# Patient Record
Sex: Male | Born: 1940 | Race: White | Hispanic: No | Marital: Married | Smoking: Former smoker
Health system: Southern US, Community
[De-identification: ages and names within clinical notes are randomized; demographics above are authoritative.]

## PROBLEM LIST (undated history)

## (undated) DIAGNOSIS — R52 Pain, unspecified: Secondary | ICD-10-CM

## (undated) DIAGNOSIS — I499 Cardiac arrhythmia, unspecified: Secondary | ICD-10-CM

## (undated) DIAGNOSIS — R51 Headache: Secondary | ICD-10-CM

## (undated) DIAGNOSIS — I2699 Other pulmonary embolism without acute cor pulmonale: Secondary | ICD-10-CM

## (undated) DIAGNOSIS — I493 Ventricular premature depolarization: Secondary | ICD-10-CM

## (undated) DIAGNOSIS — M79606 Pain in leg, unspecified: Secondary | ICD-10-CM

## (undated) DIAGNOSIS — M25552 Pain in left hip: Secondary | ICD-10-CM

## (undated) DIAGNOSIS — K219 Gastro-esophageal reflux disease without esophagitis: Secondary | ICD-10-CM

## (undated) DIAGNOSIS — T791XXA Fat embolism (traumatic), initial encounter: Secondary | ICD-10-CM

## (undated) DIAGNOSIS — I10 Essential (primary) hypertension: Secondary | ICD-10-CM

## (undated) DIAGNOSIS — G8929 Other chronic pain: Secondary | ICD-10-CM

## (undated) DIAGNOSIS — N529 Male erectile dysfunction, unspecified: Secondary | ICD-10-CM

## (undated) DIAGNOSIS — M1990 Unspecified osteoarthritis, unspecified site: Secondary | ICD-10-CM

## (undated) DIAGNOSIS — E785 Hyperlipidemia, unspecified: Secondary | ICD-10-CM

## (undated) DIAGNOSIS — F419 Anxiety disorder, unspecified: Secondary | ICD-10-CM

## (undated) HISTORY — DX: Hyperlipidemia, unspecified: E78.5

## (undated) HISTORY — PX: EYE SURGERY: SHX253

## (undated) HISTORY — PX: JOINT REPLACEMENT: SHX530

## (undated) HISTORY — DX: Gastro-esophageal reflux disease without esophagitis: K21.9

## (undated) HISTORY — DX: Anxiety disorder, unspecified: F41.9

## (undated) HISTORY — DX: Male erectile dysfunction, unspecified: N52.9

## (undated) HISTORY — DX: Unspecified osteoarthritis, unspecified site: M19.90

## (undated) HISTORY — PX: HERNIA REPAIR: SHX51

## (undated) HISTORY — DX: Ventricular premature depolarization: I49.3

---

## 1993-11-04 DIAGNOSIS — T791XXA Fat embolism (traumatic), initial encounter: Secondary | ICD-10-CM

## 1993-11-04 HISTORY — DX: Fat embolism (traumatic), initial encounter: T79.1XXA

## 1993-11-04 HISTORY — PX: KNEE SURGERY: SHX244

## 2004-08-21 ENCOUNTER — Encounter: Admission: RE | Admit: 2004-08-21 | Discharge: 2004-08-21 | Payer: Self-pay | Admitting: Orthopaedic Surgery

## 2004-08-24 ENCOUNTER — Emergency Department (HOSPITAL_COMMUNITY): Admission: EM | Admit: 2004-08-24 | Discharge: 2004-08-25 | Payer: Self-pay | Admitting: *Deleted

## 2005-05-13 ENCOUNTER — Inpatient Hospital Stay (HOSPITAL_COMMUNITY): Admission: RE | Admit: 2005-05-13 | Discharge: 2005-05-16 | Payer: Self-pay | Admitting: Orthopaedic Surgery

## 2005-06-07 ENCOUNTER — Inpatient Hospital Stay (HOSPITAL_COMMUNITY): Admission: EM | Admit: 2005-06-07 | Discharge: 2005-06-07 | Payer: Self-pay | Admitting: Emergency Medicine

## 2006-07-05 ENCOUNTER — Emergency Department (HOSPITAL_COMMUNITY): Admission: EM | Admit: 2006-07-05 | Discharge: 2006-07-05 | Payer: Self-pay | Admitting: Emergency Medicine

## 2009-01-15 ENCOUNTER — Emergency Department (HOSPITAL_COMMUNITY): Admission: EM | Admit: 2009-01-15 | Discharge: 2009-01-15 | Payer: Self-pay | Admitting: Emergency Medicine

## 2010-03-29 ENCOUNTER — Ambulatory Visit (HOSPITAL_COMMUNITY): Admission: RE | Admit: 2010-03-29 | Discharge: 2010-03-29 | Payer: Self-pay | Admitting: Orthopedic Surgery

## 2010-06-25 ENCOUNTER — Emergency Department (HOSPITAL_COMMUNITY): Admission: EM | Admit: 2010-06-25 | Discharge: 2010-06-25 | Payer: Self-pay | Admitting: Emergency Medicine

## 2010-08-04 DIAGNOSIS — I2699 Other pulmonary embolism without acute cor pulmonale: Secondary | ICD-10-CM

## 2010-08-04 HISTORY — DX: Other pulmonary embolism without acute cor pulmonale: I26.99

## 2010-08-11 ENCOUNTER — Ambulatory Visit: Payer: Self-pay | Admitting: Pulmonary Disease

## 2010-08-11 ENCOUNTER — Inpatient Hospital Stay (HOSPITAL_COMMUNITY): Admission: EM | Admit: 2010-08-11 | Discharge: 2010-08-20 | Payer: Self-pay | Admitting: Emergency Medicine

## 2010-08-13 ENCOUNTER — Encounter (INDEPENDENT_AMBULATORY_CARE_PROVIDER_SITE_OTHER): Payer: Self-pay | Admitting: Internal Medicine

## 2010-08-13 ENCOUNTER — Ambulatory Visit: Payer: Self-pay | Admitting: Vascular Surgery

## 2010-08-13 ENCOUNTER — Encounter: Payer: Self-pay | Admitting: Pulmonary Disease

## 2010-11-24 ENCOUNTER — Encounter: Payer: Self-pay | Admitting: Orthopaedic Surgery

## 2011-01-16 LAB — CBC
HCT: 32.7 % — ABNORMAL LOW (ref 39.0–52.0)
HCT: 32.9 % — ABNORMAL LOW (ref 39.0–52.0)
HCT: 34 % — ABNORMAL LOW (ref 39.0–52.0)
HCT: 35.2 % — ABNORMAL LOW (ref 39.0–52.0)
HCT: 35.5 % — ABNORMAL LOW (ref 39.0–52.0)
HCT: 36.8 % — ABNORMAL LOW (ref 39.0–52.0)
HCT: 36.9 % — ABNORMAL LOW (ref 39.0–52.0)
HCT: 38.4 % — ABNORMAL LOW (ref 39.0–52.0)
Hemoglobin: 11.2 g/dL — ABNORMAL LOW (ref 13.0–17.0)
Hemoglobin: 11.2 g/dL — ABNORMAL LOW (ref 13.0–17.0)
Hemoglobin: 11.8 g/dL — ABNORMAL LOW (ref 13.0–17.0)
Hemoglobin: 11.9 g/dL — ABNORMAL LOW (ref 13.0–17.0)
Hemoglobin: 12.1 g/dL — ABNORMAL LOW (ref 13.0–17.0)
Hemoglobin: 12.5 g/dL — ABNORMAL LOW (ref 13.0–17.0)
Hemoglobin: 12.5 g/dL — ABNORMAL LOW (ref 13.0–17.0)
MCH: 28.4 pg (ref 26.0–34.0)
MCH: 28.7 pg (ref 26.0–34.0)
MCH: 28.7 pg (ref 26.0–34.0)
MCH: 28.8 pg (ref 26.0–34.0)
MCH: 29.1 pg (ref 26.0–34.0)
MCH: 29.2 pg (ref 26.0–34.0)
MCH: 29.4 pg (ref 26.0–34.0)
MCH: 29.8 pg (ref 26.0–34.0)
MCHC: 33.2 g/dL (ref 30.0–36.0)
MCHC: 33.8 g/dL (ref 30.0–36.0)
MCHC: 34 g/dL (ref 30.0–36.0)
MCHC: 34 g/dL (ref 30.0–36.0)
MCHC: 34 g/dL (ref 30.0–36.0)
MCHC: 34.3 g/dL (ref 30.0–36.0)
MCHC: 34.4 g/dL (ref 30.0–36.0)
MCV: 84.4 fL (ref 78.0–100.0)
MCV: 84.6 fL (ref 78.0–100.0)
MCV: 85 fL (ref 78.0–100.0)
MCV: 85.2 fL (ref 78.0–100.0)
MCV: 85.3 fL (ref 78.0–100.0)
MCV: 85.9 fL (ref 78.0–100.0)
MCV: 85.9 fL (ref 78.0–100.0)
Platelets: 190 10*3/uL (ref 150–400)
Platelets: 204 10*3/uL (ref 150–400)
Platelets: 289 10*3/uL (ref 150–400)
Platelets: 296 10*3/uL (ref 150–400)
RBC: 3.7 MIL/uL — ABNORMAL LOW (ref 4.22–5.81)
RBC: 3.78 MIL/uL — ABNORMAL LOW (ref 4.22–5.81)
RBC: 3.99 MIL/uL — ABNORMAL LOW (ref 4.22–5.81)
RBC: 4.14 MIL/uL — ABNORMAL LOW (ref 4.22–5.81)
RBC: 4.16 MIL/uL — ABNORMAL LOW (ref 4.22–5.81)
RBC: 4.16 MIL/uL — ABNORMAL LOW (ref 4.22–5.81)
RBC: 4.36 MIL/uL (ref 4.22–5.81)
RBC: 4.47 MIL/uL (ref 4.22–5.81)
RDW: 12.8 % (ref 11.5–15.5)
RDW: 12.9 % (ref 11.5–15.5)
RDW: 13.3 % (ref 11.5–15.5)
WBC: 10.2 10*3/uL (ref 4.0–10.5)
WBC: 6.8 10*3/uL (ref 4.0–10.5)
WBC: 7 10*3/uL (ref 4.0–10.5)
WBC: 7.3 10*3/uL (ref 4.0–10.5)
WBC: 9.6 10*3/uL (ref 4.0–10.5)

## 2011-01-16 LAB — COMPREHENSIVE METABOLIC PANEL
ALT: 15 U/L (ref 0–53)
ALT: 17 U/L (ref 0–53)
ALT: 27 U/L (ref 0–53)
ALT: 27 U/L (ref 0–53)
ALT: 32 U/L (ref 0–53)
AST: 21 U/L (ref 0–37)
AST: 24 U/L (ref 0–37)
AST: 24 U/L (ref 0–37)
AST: 24 U/L (ref 0–37)
AST: 33 U/L (ref 0–37)
Albumin: 2.2 g/dL — ABNORMAL LOW (ref 3.5–5.2)
Albumin: 2.2 g/dL — ABNORMAL LOW (ref 3.5–5.2)
Albumin: 2.3 g/dL — ABNORMAL LOW (ref 3.5–5.2)
Alkaline Phosphatase: 46 U/L (ref 39–117)
Alkaline Phosphatase: 47 U/L (ref 39–117)
Alkaline Phosphatase: 48 U/L (ref 39–117)
Alkaline Phosphatase: 56 U/L (ref 39–117)
Alkaline Phosphatase: 56 U/L (ref 39–117)
BUN: 2 mg/dL — ABNORMAL LOW (ref 6–23)
BUN: 2 mg/dL — ABNORMAL LOW (ref 6–23)
BUN: 2 mg/dL — ABNORMAL LOW (ref 6–23)
BUN: 3 mg/dL — ABNORMAL LOW (ref 6–23)
BUN: 5 mg/dL — ABNORMAL LOW (ref 6–23)
BUN: 5 mg/dL — ABNORMAL LOW (ref 6–23)
BUN: 6 mg/dL (ref 6–23)
CO2: 23 mEq/L (ref 19–32)
CO2: 24 mEq/L (ref 19–32)
CO2: 25 mEq/L (ref 19–32)
CO2: 25 mEq/L (ref 19–32)
CO2: 25 mEq/L (ref 19–32)
CO2: 27 mEq/L (ref 19–32)
CO2: 27 mEq/L (ref 19–32)
Calcium: 8.5 mg/dL (ref 8.4–10.5)
Calcium: 8.7 mg/dL (ref 8.4–10.5)
Calcium: 8.7 mg/dL (ref 8.4–10.5)
Calcium: 8.8 mg/dL (ref 8.4–10.5)
Calcium: 9.2 mg/dL (ref 8.4–10.5)
Calcium: 9.5 mg/dL (ref 8.4–10.5)
Chloride: 106 mEq/L (ref 96–112)
Chloride: 109 mEq/L (ref 96–112)
Chloride: 110 mEq/L (ref 96–112)
Chloride: 111 mEq/L (ref 96–112)
Chloride: 112 mEq/L (ref 96–112)
Chloride: 112 mEq/L (ref 96–112)
Creatinine, Ser: 0.57 mg/dL (ref 0.4–1.5)
Creatinine, Ser: 0.6 mg/dL (ref 0.4–1.5)
Creatinine, Ser: 0.61 mg/dL (ref 0.4–1.5)
Creatinine, Ser: 0.69 mg/dL (ref 0.4–1.5)
Creatinine, Ser: 0.72 mg/dL (ref 0.4–1.5)
Creatinine, Ser: 0.77 mg/dL (ref 0.4–1.5)
GFR calc Af Amer: >60 mL/min (ref >60)
GFR calc Af Amer: >60 mL/min (ref >60)
GFR calc Af Amer: >60 mL/min (ref >60)
GFR calc Af Amer: >60 mL/min (ref >60)
GFR calc Af Amer: >60 mL/min (ref >60)
GFR calc non Af Amer: >60 mL/min (ref >60)
GFR calc non Af Amer: >60 mL/min (ref >60)
GFR calc non Af Amer: >60 mL/min (ref >60)
GFR calc non Af Amer: >60 mL/min (ref >60)
GFR calc non Af Amer: >60 mL/min (ref >60)
GFR calc non Af Amer: >60 mL/min (ref >60)
GFR calc non Af Amer: >60 mL/min (ref >60)
GFR calc non Af Amer: >60 mL/min (ref >60)
Glucose, Bld: 124 mg/dL — ABNORMAL HIGH (ref 70–99)
Glucose, Bld: 93 mg/dL (ref 70–99)
Glucose, Bld: 95 mg/dL (ref 70–99)
Glucose, Bld: 97 mg/dL (ref 70–99)
Glucose, Bld: 97 mg/dL (ref 70–99)
Glucose, Bld: 97 mg/dL (ref 70–99)
Glucose, Bld: 98 mg/dL (ref 70–99)
Potassium: 3.2 mEq/L — ABNORMAL LOW (ref 3.5–5.1)
Potassium: 3.7 mEq/L (ref 3.5–5.1)
Potassium: 3.7 mEq/L (ref 3.5–5.1)
Potassium: 3.9 mEq/L (ref 3.5–5.1)
Potassium: 4.2 mEq/L (ref 3.5–5.1)
Sodium: 138 mEq/L (ref 135–145)
Sodium: 139 mEq/L (ref 135–145)
Sodium: 139 mEq/L (ref 135–145)
Sodium: 141 mEq/L (ref 135–145)
Total Bilirubin: 0.8 mg/dL (ref 0.3–1.2)
Total Bilirubin: 0.8 mg/dL (ref 0.3–1.2)
Total Bilirubin: 0.8 mg/dL (ref 0.3–1.2)
Total Bilirubin: 0.8 mg/dL (ref 0.3–1.2)
Total Bilirubin: 0.9 mg/dL (ref 0.3–1.2)
Total Bilirubin: 0.9 mg/dL (ref 0.3–1.2)
Total Protein: 5.7 g/dL — ABNORMAL LOW (ref 6.0–8.3)
Total Protein: 5.7 g/dL — ABNORMAL LOW (ref 6.0–8.3)
Total Protein: 6 g/dL (ref 6.0–8.3)
Total Protein: 6.5 g/dL (ref 6.0–8.3)

## 2011-01-16 LAB — DIFFERENTIAL
Basophils Relative: 0 % (ref 0–1)
Eosinophils Absolute: 0.3 10*3/uL (ref 0.0–0.7)
Eosinophils Absolute: 0.4 10*3/uL (ref 0.0–0.7)
Eosinophils Relative: 5 % (ref 0–5)
Eosinophils Relative: 5 % (ref 0–5)
Lymphocytes Relative: 13 % (ref 12–46)
Lymphs Abs: 1 10*3/uL (ref 0.7–4.0)
Lymphs Abs: 1 10*3/uL (ref 0.7–4.0)
Monocytes Absolute: 0.9 10*3/uL (ref 0.1–1.0)
Monocytes Relative: 10 % (ref 3–12)
Monocytes Relative: 11 % (ref 3–12)
Neutrophils Relative %: 69 % (ref 43–77)

## 2011-01-16 LAB — HEPARIN LEVEL (UNFRACTIONATED)
Heparin Unfractionated: 0.23 IU/mL — ABNORMAL LOW (ref 0.30–0.70)
Heparin Unfractionated: 0.46 IU/mL (ref 0.30–0.70)
Heparin Unfractionated: 0.47 IU/mL (ref 0.30–0.70)
Heparin Unfractionated: 0.51 IU/mL (ref 0.30–0.70)
Heparin Unfractionated: 0.67 IU/mL (ref 0.30–0.70)

## 2011-01-16 LAB — CULTURE, BLOOD (ROUTINE X 2)
Culture  Setup Time: 201110101735
Culture  Setup Time: 201110101735

## 2011-01-16 LAB — MRSA PCR SCREENING: MRSA by PCR: NEGATIVE

## 2011-01-16 LAB — GLUCOSE, CAPILLARY
Glucose-Capillary: 125 mg/dL — ABNORMAL HIGH (ref 70–99)
Glucose-Capillary: 128 mg/dL — ABNORMAL HIGH (ref 70–99)
Glucose-Capillary: 129 mg/dL — ABNORMAL HIGH (ref 70–99)
Glucose-Capillary: 143 mg/dL — ABNORMAL HIGH (ref 70–99)

## 2011-01-16 LAB — PROTIME-INR
INR: 1.18 (ref 0.00–1.49)
INR: 1.32 (ref 0.00–1.49)
INR: 1.36 (ref 0.00–1.49)
INR: 1.53 — ABNORMAL HIGH (ref 0.00–1.49)
INR: 2.28 — ABNORMAL HIGH (ref 0.00–1.49)
INR: 2.34 — ABNORMAL HIGH (ref 0.00–1.49)
Prothrombin Time: 15.2 seconds (ref 11.6–15.2)
Prothrombin Time: 16.6 seconds — ABNORMAL HIGH (ref 11.6–15.2)
Prothrombin Time: 17 seconds — ABNORMAL HIGH (ref 11.6–15.2)
Prothrombin Time: 17.3 seconds — ABNORMAL HIGH (ref 11.6–15.2)
Prothrombin Time: 18.6 seconds — ABNORMAL HIGH (ref 11.6–15.2)
Prothrombin Time: 21.2 seconds — ABNORMAL HIGH (ref 11.6–15.2)
Prothrombin Time: 24 seconds — ABNORMAL HIGH (ref 11.6–15.2)
Prothrombin Time: 25.3 seconds — ABNORMAL HIGH (ref 11.6–15.2)
Prothrombin Time: 29.4 seconds — ABNORMAL HIGH (ref 11.6–15.2)

## 2011-01-16 LAB — BLOOD GAS, ARTERIAL
Acid-base deficit: 1.1 mmol/L (ref 0.0–2.0)
Drawn by: 29165
O2 Saturation: 89.3 %
pH, Arterial: 7.439 (ref 7.350–7.450)

## 2011-01-16 LAB — POCT I-STAT 3, ART BLOOD GAS (G3+)
Acid-base deficit: 3 mmol/L — ABNORMAL HIGH (ref 0.0–2.0)
Bicarbonate: 21.7 mEq/L (ref 20.0–24.0)
Patient temperature: 98.7
pH, Arterial: 7.405 (ref 7.350–7.450)

## 2011-01-16 LAB — BRAIN NATRIURETIC PEPTIDE: Pro B Natriuretic peptide (BNP): 304 pg/mL — ABNORMAL HIGH (ref 0.0–100.0)

## 2011-01-16 LAB — BASIC METABOLIC PANEL
CO2: 25 mEq/L (ref 19–32)
Chloride: 107 mEq/L (ref 96–112)
Creatinine, Ser: 0.7 mg/dL (ref 0.4–1.5)
GFR calc Af Amer: >60 mL/min (ref >60)
Potassium: 4 mEq/L (ref 3.5–5.1)

## 2011-01-16 LAB — PROTHROMBIN GENE MUTATION

## 2011-01-16 LAB — BETA-2-GLYCOPROTEIN I ABS, IGG/M/A
Beta-2-Glycoprotein I IgA: 16 A Units (ref <20)
Beta-2-Glycoprotein I IgM: 2 M Units (ref <20)

## 2011-01-16 LAB — PHOSPHORUS: Phosphorus: 2.5 mg/dL (ref 2.3–4.6)

## 2011-01-16 LAB — POCT CARDIAC MARKERS: Troponin i, poc: 0.07 ng/mL (ref 0.00–0.09)

## 2011-01-16 LAB — PROTEIN C ACTIVITY: Protein C Activity: 97 % (ref 75–133)

## 2011-01-16 LAB — LUPUS ANTICOAGULANT PANEL
Lupus Anticoagulant: DETECTED — AB
PTTLA 4:1 Mix: 108.8 secs — ABNORMAL HIGH (ref 30.0–45.6)
PTTLA Confirmation: 17.8 secs — ABNORMAL HIGH (ref <8.0)
dRVVT Incubated 1:1 Mix: 42.8 secs (ref 36.2–44.3)

## 2011-01-16 LAB — APTT: aPTT: 131 seconds — ABNORMAL HIGH (ref 24–37)

## 2011-01-16 LAB — LEGIONELLA ANTIGEN, URINE

## 2011-01-18 LAB — CBC
MCH: 29.5 pg (ref 26.0–34.0)
MCHC: 34.4 g/dL (ref 30.0–36.0)
Platelets: 155 10*3/uL (ref 150–400)
RDW: 13.4 % (ref 11.5–15.5)

## 2011-01-18 LAB — COMPREHENSIVE METABOLIC PANEL
AST: 21 U/L (ref 0–37)
Albumin: 3.8 g/dL (ref 3.5–5.2)
Calcium: 9.9 mg/dL (ref 8.4–10.5)
Creatinine, Ser: 0.85 mg/dL (ref 0.4–1.5)
GFR calc Af Amer: >60 mL/min (ref >60)
GFR calc non Af Amer: >60 mL/min (ref >60)

## 2011-01-18 LAB — DIFFERENTIAL
Eosinophils Relative: 0 % (ref 0–5)
Lymphocytes Relative: 9 % — ABNORMAL LOW (ref 12–46)
Lymphs Abs: 0.6 10*3/uL — ABNORMAL LOW (ref 0.7–4.0)
Monocytes Absolute: 0.5 10*3/uL (ref 0.1–1.0)

## 2011-01-18 LAB — POCT CARDIAC MARKERS
CKMB, poc: <1 ng/mL — ABNORMAL LOW (ref 1.0–8.0)
CKMB, poc: <1 ng/mL — ABNORMAL LOW (ref 1.0–8.0)
Myoglobin, poc: 53.3 ng/mL (ref 12–200)
Troponin i, poc: <0.05 ng/mL (ref 0.00–0.09)

## 2011-01-18 LAB — LIPASE, BLOOD: Lipase: 24 U/L (ref 11–59)

## 2011-02-14 LAB — COMPREHENSIVE METABOLIC PANEL
ALT: 16 U/L (ref 0–53)
AST: 20 U/L (ref 0–37)
Alkaline Phosphatase: 48 U/L (ref 39–117)
CO2: 28 mEq/L (ref 19–32)
Chloride: 104 mEq/L (ref 96–112)
GFR calc Af Amer: >60 mL/min (ref >60)
GFR calc non Af Amer: >60 mL/min (ref >60)
Potassium: 4.1 mEq/L (ref 3.5–5.1)
Sodium: 136 mEq/L (ref 135–145)
Total Bilirubin: 1.3 mg/dL — ABNORMAL HIGH (ref 0.3–1.2)

## 2011-02-14 LAB — CBC
Hemoglobin: 14.7 g/dL (ref 13.0–17.0)
RBC: 4.94 MIL/uL (ref 4.22–5.81)
WBC: 5.2 10*3/uL (ref 4.0–10.5)

## 2011-02-14 LAB — POCT CARDIAC MARKERS
Myoglobin, poc: 35.2 ng/mL (ref 12–200)
Myoglobin, poc: 39.8 ng/mL (ref 12–200)

## 2011-02-14 LAB — DIFFERENTIAL
Basophils Absolute: 0 10*3/uL (ref 0.0–0.1)
Basophils Relative: 1 % (ref 0–1)
Eosinophils Absolute: 0.1 10*3/uL (ref 0.0–0.7)
Eosinophils Relative: 1 % (ref 0–5)
Lymphs Abs: 1 10*3/uL (ref 0.7–4.0)

## 2011-02-14 LAB — PROTIME-INR: Prothrombin Time: 13.5 seconds (ref 11.6–15.2)

## 2011-03-22 NOTE — H&P (Signed)
NAMEBUCK, MCAFFEE NO.:  [id number]   MEDICAL RECORD NO.:  [id number]          PATIENT TYPE:  INP   LOCATION:  5016                         FACILITY:  [facility]   PHYSICIAN:  Burnard Bunting, M.D.    DATE OF BIRTH:  [birth year]   DATE OF ADMISSION:  06/07/2005  DATE OF DISCHARGE:                                HISTORY & PHYSICAL   CHIEF COMPLAINT:  Left hip pain.   HISTORY OF PRESENT ILLNESS:  [name] is a 70 year old male, one  month status post THA revision, who reports left hip pain after sleeping  tonight.  Discontinue the abduction pillow today.  The hip dislocated in his  sleep tonight.   PAST MEDICAL/SURGICAL HISTORY:  1.  Notable for hypertension.  2.  Borderline diabetes.  3.  Total hip arthroplasty in 1979, complicated by fat embolism with six      revisions since that time.   CURRENT MEDICATIONS:  1.  Atenolol.  2.  Aciphex.  3.  Vytorin.   ALLERGIES:  No known drug allergies.   PHYSICAL EXAMINATION:  GENERAL:  He is in no distress.  CHEST:  Clear to auscultation.  HEART:  A regular rate and rhythm.  ABDOMEN:  Benign.  EXTREMITIES:  Left lower extremity is shortened and externally rotated.  Dorsiflexion and plantar flexion intact.  Pulses are intact.   Electrocardiogram:  A normal sinus rhythm.   Hematocrit 36.  Sodium 143.9.   X-rays show posterior left hip dislocation.   IMPRESSION:  Left hip dislocation.   PLAN:  A closed reduction.  The risks and benefits were discussed with the  patient.  They include but are not limited to a fracture and inability to  reduce the hip.  He __________ the case.  The patient will be awakened and  scheduled for a possible open reduction by Dr. Loraine Leriche C. Yates later today.       GSD/MEDQ  D:  06/07/2005  T:  06/07/2005  Job:  [id number]

## 2011-03-22 NOTE — Op Note (Signed)
NAMENOAM, KARAFFA                 ACCOUNT NO.:  [id number]   MEDICAL RECORD NO.:  [id number]          PATIENT TYPE:  INP   LOCATION:  2550                         FACILITY:  [facility]   PHYSICIAN:  Mark C. Ophelia Charter, M.D.    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   OPERATION PERFORMED:  Left total hip arthroplasty revision of acetabular  component, ball exchange and trochanteric wiring.   SURGEON:  Mark C. Ophelia Charter, M.D.   ASSISTANT:  Vanita Panda. Magnus Ivan, M.D.   ANESTHESIA:  General orotracheal anesthesia.   ESTIMATED BLOOD LOSS:  1400 mL.   BLOOD REPLACED:  One unit packed cells.   DESCRIPTION OF PROCEDURE:  After induction of general anesthesia,  orotracheal intubation, preoperative Ancef prophylaxis, patient was placed  in the lateral position with the left hip up and an axillary roll. Standard  prepping and draping was performed for total hip arthroplasty, sterile skin  marker on the planned incision which was one of four or five incisions  already on his hip. Posterior approach was made.  Trochanter was off and  there were immediately two loose cables with severe metallosis present.  Cables were cut with a large bolt cutter and cables were removed.  Trochanter was displaced superiorly and elevation of the trochanter was  performed following down the soft tissue with polyethylene debris and metal  colored synovium down to the acetabulum.  The hip was flipped and was opened  80 to 90 degrees of abduction.  It was loose and one of the screws was  broken which was apparent once the cup was removed using osteotome, breaking  the wire and then  separating the poly from the shell.  One screw was  removed.  A second screw from old previous revision was buried underneath  the bone.  Final screw that had broken had to be removed with a small  osteotome using the Nezhat system, finally grasping it with a rongeur and  extracting it.  A Sony reamer was inserted.  The old cup was a 70 and a  secure fit Osteonics 74 mm cup was selected.  40 cc of bone graft was placed  in the acetabulum after meticulous debridement scraping with the curettes.  There was one central hole that did not require ___________ and bone graft  was placed in it and reamer was placed in reverse.  A 74 mm cup was  inserted.  There was no solid posterior wall and the posterolateral rim was  paper thin and had broken similar to posterior lateral acetabular fracture.  Laterally, there was still satisfactory bone.  Site nuts were palpated and a  total of four screws were inserted varying between 15 and 30 mm in length.  After irrigation with saline solution, trial was inserted and a 36 ball  was  selected, +5 exchanging it for the 0 ball.  There was excellent stability  with flexion to 90 degrees, internal rotation to 90.  Permanent acetabular  liner was inserted, placed with the lip posterolateral.  A 36 ball was  popped on +5 after cleaning the trunnion.  The trochanter was then wired  back using three figure-of-eight sutures 18 gauge wires.  The trochanter  piece was thinned and was retracted an although it was not quite anatomic  despite the multiple wiring plus multiple sutures through the adjacent soft  tissue, it did give some stability to the hip and reduced the trochanter in  satisfactory position.  Although there was still a wobble.  Previously the  cables were cut out and it was felt that the cables and the cable clamp  through the soft trochanter would not be adequate.  Wires were twisted down  and figure-of-eight sutures placed.  Tensor fascia was closed with  #1  Tycron, 2-0 Vicryl in the subcutaneous tissues, skin staple closure and  Marcaine infiltration and knee immobilizer after dressing.  The patient  tolerated the procedure well and was transferred to recovery in stable  condition.       MCY/MEDQ  D:  05/13/2005  T:  05/14/2005  Job:  [id number]

## 2011-03-22 NOTE — Op Note (Signed)
NAMEKAVONTE, BEARSE NO.:  [id number]   MEDICAL RECORD NO.:  [id number]          PATIENT TYPE:  INP   LOCATION:  5016                         FACILITY:  [facility]   PHYSICIAN:  Burnard Bunting, M.D.    DATE OF BIRTH:  08/21/1941   DATE OF PROCEDURE:  06/07/2005  DATE OF DISCHARGE:                                 OPERATIVE REPORT   PREOPERATIVE DIAGNOSIS:  Left total hip dislocation.   POSTOPERATIVE DIAGNOSIS:  Left total hip dislocation.   PROCEDURE:  Left total hip closed reduction.   SURGEON:  Burnard Bunting, M.D.   ANESTHESIA:  General.   ESTIMATED BLOOD LOSS:  Minimal.   PROCEDURE IN DETAIL:  The patient was brought to the operating room where  general endotracheal anesthesia was induced.  Using a combination of hip  flexion and distraction, the hip was reduced with an audible clunk with  minimal effort.  The hip was relatively stable with internal and external  rotation.  Fluoroscopic guidance confirmed reduction.  Knee immobilizer was  placed.  Leg lengths were approximately equal.  The patient was extubated  and transferred to the recovery room in stable condition.       GSD/MEDQ  D:  06/07/2005  T:  06/07/2005  Job:  [id number]

## 2011-11-14 DIAGNOSIS — H9209 Otalgia, unspecified ear: Secondary | ICD-10-CM | POA: Diagnosis not present

## 2011-11-14 DIAGNOSIS — H6120 Impacted cerumen, unspecified ear: Secondary | ICD-10-CM | POA: Diagnosis not present

## 2011-11-20 DIAGNOSIS — S8990XA Unspecified injury of unspecified lower leg, initial encounter: Secondary | ICD-10-CM | POA: Diagnosis not present

## 2011-11-20 DIAGNOSIS — I2699 Other pulmonary embolism without acute cor pulmonale: Secondary | ICD-10-CM | POA: Diagnosis not present

## 2011-11-20 DIAGNOSIS — Z7901 Long term (current) use of anticoagulants: Secondary | ICD-10-CM | POA: Diagnosis not present

## 2012-01-01 DIAGNOSIS — I2699 Other pulmonary embolism without acute cor pulmonale: Secondary | ICD-10-CM | POA: Diagnosis not present

## 2012-01-01 DIAGNOSIS — Z7901 Long term (current) use of anticoagulants: Secondary | ICD-10-CM | POA: Diagnosis not present

## 2012-02-21 DIAGNOSIS — M169 Osteoarthritis of hip, unspecified: Secondary | ICD-10-CM | POA: Diagnosis not present

## 2012-02-21 DIAGNOSIS — Z7901 Long term (current) use of anticoagulants: Secondary | ICD-10-CM | POA: Diagnosis not present

## 2012-02-21 DIAGNOSIS — I2699 Other pulmonary embolism without acute cor pulmonale: Secondary | ICD-10-CM | POA: Diagnosis not present

## 2012-03-11 DIAGNOSIS — E785 Hyperlipidemia, unspecified: Secondary | ICD-10-CM | POA: Diagnosis not present

## 2012-03-11 DIAGNOSIS — I10 Essential (primary) hypertension: Secondary | ICD-10-CM | POA: Diagnosis not present

## 2012-03-11 DIAGNOSIS — I2699 Other pulmonary embolism without acute cor pulmonale: Secondary | ICD-10-CM | POA: Diagnosis not present

## 2012-03-11 DIAGNOSIS — M1990 Unspecified osteoarthritis, unspecified site: Secondary | ICD-10-CM | POA: Diagnosis not present

## 2012-03-17 DIAGNOSIS — M169 Osteoarthritis of hip, unspecified: Secondary | ICD-10-CM | POA: Diagnosis not present

## 2012-03-31 DIAGNOSIS — I2699 Other pulmonary embolism without acute cor pulmonale: Secondary | ICD-10-CM | POA: Diagnosis not present

## 2012-03-31 DIAGNOSIS — Z7901 Long term (current) use of anticoagulants: Secondary | ICD-10-CM | POA: Diagnosis not present

## 2012-03-31 DIAGNOSIS — M545 Low back pain: Secondary | ICD-10-CM | POA: Diagnosis not present

## 2012-03-31 DIAGNOSIS — M1990 Unspecified osteoarthritis, unspecified site: Secondary | ICD-10-CM | POA: Diagnosis not present

## 2012-04-04 DIAGNOSIS — M545 Low back pain: Secondary | ICD-10-CM | POA: Diagnosis not present

## 2012-04-10 DIAGNOSIS — S32009A Unspecified fracture of unspecified lumbar vertebra, initial encounter for closed fracture: Secondary | ICD-10-CM | POA: Diagnosis not present

## 2012-04-10 DIAGNOSIS — M5137 Other intervertebral disc degeneration, lumbosacral region: Secondary | ICD-10-CM | POA: Diagnosis not present

## 2012-04-16 ENCOUNTER — Telehealth (HOSPITAL_COMMUNITY): Payer: Self-pay

## 2012-04-16 NOTE — Telephone Encounter (Signed)
I received a ref fax from [place] at Dr. Grant Fontana office.  Since the MRI was not done within the [facility] system I called Scott and left a vm stating that we need a copy of the MRI sent to [facility] 1st floor radiology atten [facility]

## 2012-04-17 ENCOUNTER — Other Ambulatory Visit (HOSPITAL_COMMUNITY): Payer: Self-pay | Admitting: Physical Medicine and Rehabilitation

## 2012-04-17 DIAGNOSIS — IMO0002 Reserved for concepts with insufficient information to code with codable children: Secondary | ICD-10-CM

## 2012-04-20 ENCOUNTER — Ambulatory Visit (HOSPITAL_COMMUNITY)
Admission: RE | Admit: 2012-04-20 | Discharge: 2012-04-20 | Disposition: A | Discharge status: Home / Self Care | Payer: Medicare Other | Source: Ambulatory Visit | Attending: Physical Medicine and Rehabilitation | Admitting: Physical Medicine and Rehabilitation

## 2012-04-20 DIAGNOSIS — IMO0002 Reserved for concepts with insufficient information to code with codable children: Secondary | ICD-10-CM

## 2012-04-20 DIAGNOSIS — M545 Low back pain: Secondary | ICD-10-CM | POA: Diagnosis not present

## 2012-04-20 DIAGNOSIS — M8448XA Pathological fracture, other site, initial encounter for fracture: Secondary | ICD-10-CM | POA: Diagnosis not present

## 2012-04-20 DIAGNOSIS — Z96649 Presence of unspecified artificial hip joint: Secondary | ICD-10-CM | POA: Diagnosis not present

## 2012-04-21 ENCOUNTER — Other Ambulatory Visit: Payer: Self-pay | Admitting: Radiology

## 2012-04-21 ENCOUNTER — Other Ambulatory Visit (HOSPITAL_COMMUNITY): Payer: Self-pay | Admitting: Interventional Radiology

## 2012-04-21 DIAGNOSIS — IMO0002 Reserved for concepts with insufficient information to code with codable children: Secondary | ICD-10-CM

## 2012-04-22 ENCOUNTER — Telehealth (HOSPITAL_COMMUNITY): Payer: Self-pay

## 2012-04-22 ENCOUNTER — Other Ambulatory Visit: Payer: Self-pay | Admitting: Radiology

## 2012-04-22 NOTE — Telephone Encounter (Signed)
I called [name] and left a vm stating that he needed to be here at 630 instead of 7am/

## 2012-04-24 ENCOUNTER — Ambulatory Visit (HOSPITAL_COMMUNITY)
Admission: RE | Admit: 2012-04-24 | Discharge: 2012-04-24 | Disposition: A | Discharge status: Home / Self Care | Payer: Medicare Other | Source: Ambulatory Visit | Attending: Interventional Radiology | Admitting: Interventional Radiology

## 2012-04-24 ENCOUNTER — Other Ambulatory Visit (HOSPITAL_COMMUNITY): Payer: Self-pay | Admitting: Interventional Radiology

## 2012-04-24 ENCOUNTER — Encounter (HOSPITAL_COMMUNITY): Payer: Self-pay

## 2012-04-24 DIAGNOSIS — S32009A Unspecified fracture of unspecified lumbar vertebra, initial encounter for closed fracture: Secondary | ICD-10-CM | POA: Diagnosis not present

## 2012-04-24 DIAGNOSIS — IMO0002 Reserved for concepts with insufficient information to code with codable children: Secondary | ICD-10-CM

## 2012-04-24 DIAGNOSIS — I2699 Other pulmonary embolism without acute cor pulmonale: Secondary | ICD-10-CM | POA: Insufficient documentation

## 2012-04-24 DIAGNOSIS — M545 Low back pain: Secondary | ICD-10-CM | POA: Diagnosis not present

## 2012-04-24 DIAGNOSIS — M8448XA Pathological fracture, other site, initial encounter for fracture: Secondary | ICD-10-CM | POA: Diagnosis not present

## 2012-04-24 DIAGNOSIS — Z86711 Personal history of pulmonary embolism: Secondary | ICD-10-CM | POA: Diagnosis not present

## 2012-04-24 DIAGNOSIS — E7800 Pure hypercholesterolemia, unspecified: Secondary | ICD-10-CM | POA: Insufficient documentation

## 2012-04-24 DIAGNOSIS — I10 Essential (primary) hypertension: Secondary | ICD-10-CM | POA: Diagnosis not present

## 2012-04-24 HISTORY — DX: Essential (primary) hypertension: I10

## 2012-04-24 HISTORY — PX: FIXATION KYPHOPLASTY LUMBAR SPINE: SHX1642

## 2012-04-24 HISTORY — DX: Cardiac arrhythmia, unspecified: I49.9

## 2012-04-24 HISTORY — DX: Other pulmonary embolism without acute cor pulmonale: I26.99

## 2012-04-24 LAB — BASIC METABOLIC PANEL
CO2: 27 mEq/L (ref 19–32)
Calcium: 9.9 mg/dL (ref 8.4–10.5)
Creatinine, Ser: 0.74 mg/dL (ref 0.50–1.35)

## 2012-04-24 LAB — CBC
MCH: 28.2 pg (ref 26.0–34.0)
MCV: 82.9 fL (ref 78.0–100.0)
Platelets: 187 10*3/uL (ref 150–400)
RDW: 13.4 % (ref 11.5–15.5)
WBC: 5.4 10*3/uL (ref 4.0–10.5)

## 2012-04-24 LAB — APTT: aPTT: 30 seconds (ref 24–37)

## 2012-04-24 MED ORDER — FENTANYL CITRATE 0.05 MG/ML IJ SOLN
INTRAMUSCULAR | Status: AC
  Filled 2012-04-24: qty 6

## 2012-04-24 MED ORDER — MIDAZOLAM HCL 2 MG/2ML IJ SOLN
INTRAMUSCULAR | Status: AC
  Filled 2012-04-24: qty 6

## 2012-04-24 MED ORDER — FENTANYL CITRATE 0.05 MG/ML IJ SOLN
INTRAMUSCULAR | Status: AC
  Filled 2012-04-24: qty 4

## 2012-04-24 MED ORDER — HYDROMORPHONE HCL PF 1 MG/ML IJ SOLN
INTRAMUSCULAR | Status: AC | PRN
  Administered 2012-04-24 (×2): 1 mg

## 2012-04-24 MED ORDER — MIDAZOLAM HCL 5 MG/5ML IJ SOLN
INTRAMUSCULAR | Status: AC | PRN
  Administered 2012-04-24 (×5): 1 mg via INTRAVENOUS

## 2012-04-24 MED ORDER — CEFAZOLIN SODIUM 1-5 GM-% IV SOLN
INTRAVENOUS | Status: AC
  Filled 2012-04-24: qty 50

## 2012-04-24 MED ORDER — MIDAZOLAM HCL 2 MG/2ML IJ SOLN
INTRAMUSCULAR | Status: AC
  Filled 2012-04-24: qty 4

## 2012-04-24 MED ORDER — HYDROMORPHONE HCL PF 1 MG/ML IJ SOLN
INTRAMUSCULAR | Status: AC
  Filled 2012-04-24: qty 2

## 2012-04-24 MED ORDER — IOHEXOL 300 MG/ML  SOLN
50.0000 mL | Freq: Once | INTRAMUSCULAR | Status: AC | PRN
  Administered 2012-04-24: 10 mL

## 2012-04-24 MED ORDER — SODIUM CHLORIDE 0.9 % IV SOLN: INTRAVENOUS | Status: AC

## 2012-04-24 MED ORDER — TOBRAMYCIN SULFATE 1.2 G IJ SOLR
INTRAMUSCULAR | Status: AC
  Filled 2012-04-24: qty 1.2

## 2012-04-24 MED ORDER — CEFAZOLIN SODIUM 1-5 GM-% IV SOLN
1.0000 g | Freq: Once | INTRAVENOUS | Status: AC
  Administered 2012-04-24: 1 g via INTRAVENOUS

## 2012-04-24 MED ORDER — HYDROMORPHONE HCL PF 1 MG/ML IJ SOLN
INTRAMUSCULAR | Status: AC
  Filled 2012-04-24: qty 3

## 2012-04-24 MED ORDER — SODIUM CHLORIDE 0.9 % IV SOLN: Freq: Once | INTRAVENOUS | Status: DC

## 2012-04-24 MED ORDER — FENTANYL CITRATE 0.05 MG/ML IJ SOLN
INTRAMUSCULAR | Status: AC | PRN
  Administered 2012-04-24 (×6): 25 ug via INTRAVENOUS

## 2012-04-24 NOTE — H&P (Signed)
Jake Adams is an 71 y.o. male.   Chief Complaint: fell at home 6 months ago MRI shows fracture in Lumbar #4 and #5 Back and left leg pain; Epidural inj no help Scheduled now for vertebroplasty vs kyphoplasty in IR HPI: HTN; hx PE; high cholesterol  Past Medical History  Diagnosis Date  . Hypertension   . Dysrhythmia   . Coronary artery disease   . Pulmonary embolism     Past Surgical History  Procedure Date  . Hernia repair   . Joint replacement     Hip x 7; knee    No family history on file. Social History:  reports that he has never smoked. He does not have any smokeless tobacco history on file. His alcohol and drug histories not on file.  Allergies: No Known Allergies   (Not in Adams hospital admission)  No results found for this or any previous visit (from the past 48 hour(s)). No results found.  Review of Systems  Constitutional: Negative for fever and chills.  HENT: Positive for hearing loss.   Respiratory: Negative for shortness of breath.   Cardiovascular: Negative for chest pain.  Gastrointestinal: Negative for nausea, vomiting and abdominal pain.  Musculoskeletal: Positive for back pain and joint pain.    Blood pressure 119/83, pulse 70, temperature 97.2 F (36.2 C), temperature source Oral, resp. rate 18, height 5\' 8"  (1.727 m), weight 208 lb (94.348 kg), SpO2 94.00%. Physical Exam  Constitutional: He is oriented to person, place, and time. He appears well-developed and well-nourished.  Cardiovascular: Normal rate, regular rhythm and normal heart sounds.   No murmur heard. Respiratory: Effort normal and breath sounds normal. He has no wheezes.  GI: Soft. Bowel sounds are normal. There is no tenderness.  Musculoskeletal: Normal range of motion.       Uses walker and cane; back and leg pain  Neurological: He is alert and oriented to person, place, and time. Coordination normal.  Skin: Skin is warm and dry.  Psychiatric: He has Adams normal mood and affect.  His behavior is normal. Judgment and thought content normal.     Assessment/Plan Painful Lumbar 4 and 5 fracture Back and left leg pain Pt scheduled for VP/KP today in IR Aware of procedure benefits and risks and agreeable to proceed. Consent in chart  Jake Adams 04/24/2012, 7:30 AM

## 2012-04-24 NOTE — Discharge Instructions (Signed)
KYPHOPLASTY/VERTEBROPLASTY DISCHARGE INSTRUCTIONS  Medications: (check all that apply)     Resume all home medications as before procedure.       Resume your (aspirin/Plavix/Coumadin) on 04/24/12.                  Continue your pain medications as prescribed as needed.  Over the next 3-5 days, decrease your pain medication as tolerated.  Over the counter medications (i.e. Tylenol, ibuprofen, and aleve) may be substituted once severe/moderate pain symptoms have subsided.   Wound Care:   Bandages may be removed the day following your procedure.  You may get your incision wet once bandages are removed.  Bandaids may be used to cover the incisions until scab formation.  Topical ointments are optional.    If you develop a fever greater than 101 degrees, have increased skin redness at the incision sites or pus-like oozing from incisions occurring within 1 week of the procedure, contact radiology at [phone] or [phone].    Ice pack to back for 15-20 minutes 2-3 time per day for first 2-3 days post procedure.  The ice will expedite muscle healing and help with the pain from the incisions.   Activity:   Bedrest today with limited activity for 24 hours post procedure.    No driving for 48 hours.    Increase your activity as tolerated after bedrest (with assistance if necessary).    Refrain from any strenuous activity or heavy lifting (greater than 10 lbs.).   Follow up:   Contact radiology at [phone] or [phone] if any questions/concerns.    A physician assistant from radiology will contact you in approximately 1 week.    If a biopsy was performed at the time of your procedure, your referring physician should receive the results in usually 2-3 days.   No stooping,lifting more than 10 lbs for 2 weeks  Use walker for 2 weeks  RTC in 2 weeks

## 2012-04-24 NOTE — ED Notes (Signed)
Patient denies pain and is resting comfortably.  

## 2012-04-24 NOTE — Procedures (Signed)
S/P Balloon KP at L4 and L5

## 2012-04-24 NOTE — ED Notes (Signed)
Ice pack applied.  Dressing CDI.  No c/o of pain.  Taking po's family at side.

## 2012-05-11 ENCOUNTER — Ambulatory Visit (HOSPITAL_COMMUNITY)
Admission: RE | Admit: 2012-05-11 | Discharge: 2012-05-11 | Disposition: A | Discharge status: Home / Self Care | Payer: Medicare Other | Source: Ambulatory Visit | Attending: Interventional Radiology | Admitting: Interventional Radiology

## 2012-05-11 DIAGNOSIS — Z7901 Long term (current) use of anticoagulants: Secondary | ICD-10-CM | POA: Diagnosis not present

## 2012-05-11 DIAGNOSIS — I2699 Other pulmonary embolism without acute cor pulmonale: Secondary | ICD-10-CM | POA: Diagnosis not present

## 2012-05-11 DIAGNOSIS — M8448XA Pathological fracture, other site, initial encounter for fracture: Secondary | ICD-10-CM | POA: Diagnosis not present

## 2012-05-11 DIAGNOSIS — IMO0002 Reserved for concepts with insufficient information to code with codable children: Secondary | ICD-10-CM

## 2012-06-22 ENCOUNTER — Other Ambulatory Visit (HOSPITAL_COMMUNITY): Payer: Self-pay | Admitting: Interventional Radiology

## 2012-06-22 DIAGNOSIS — M545 Low back pain: Secondary | ICD-10-CM

## 2012-06-22 DIAGNOSIS — IMO0002 Reserved for concepts with insufficient information to code with codable children: Secondary | ICD-10-CM

## 2012-06-23 DIAGNOSIS — I2699 Other pulmonary embolism without acute cor pulmonale: Secondary | ICD-10-CM | POA: Diagnosis not present

## 2012-06-23 DIAGNOSIS — Z7901 Long term (current) use of anticoagulants: Secondary | ICD-10-CM | POA: Diagnosis not present

## 2012-06-25 ENCOUNTER — Ambulatory Visit (HOSPITAL_COMMUNITY)
Admission: RE | Admit: 2012-06-25 | Discharge: 2012-06-25 | Disposition: A | Discharge status: Home / Self Care | Payer: Medicare Other | Source: Ambulatory Visit | Attending: Interventional Radiology | Admitting: Interventional Radiology

## 2012-06-25 DIAGNOSIS — M47817 Spondylosis without myelopathy or radiculopathy, lumbosacral region: Secondary | ICD-10-CM | POA: Diagnosis not present

## 2012-06-25 DIAGNOSIS — IMO0002 Reserved for concepts with insufficient information to code with codable children: Secondary | ICD-10-CM

## 2012-06-25 DIAGNOSIS — M4310 Spondylolisthesis, site unspecified: Secondary | ICD-10-CM | POA: Diagnosis not present

## 2012-06-25 DIAGNOSIS — M79609 Pain in unspecified limb: Secondary | ICD-10-CM | POA: Diagnosis not present

## 2012-06-25 DIAGNOSIS — M48061 Spinal stenosis, lumbar region without neurogenic claudication: Secondary | ICD-10-CM | POA: Diagnosis not present

## 2012-06-25 DIAGNOSIS — M5126 Other intervertebral disc displacement, lumbar region: Secondary | ICD-10-CM | POA: Diagnosis not present

## 2012-06-25 DIAGNOSIS — M545 Low back pain: Secondary | ICD-10-CM

## 2012-06-26 ENCOUNTER — Telehealth (HOSPITAL_COMMUNITY): Payer: Self-pay

## 2012-06-26 NOTE — Telephone Encounter (Signed)
Dr. Corliss Skains advised after reading the MRI that [name] does not have any new compression fractures.  He stated that he would advise him to go see Dr. Ethelene Hal about Epi. Injections.  [name] stated and understanding.

## 2012-07-13 DIAGNOSIS — H26499 Other secondary cataract, unspecified eye: Secondary | ICD-10-CM | POA: Diagnosis not present

## 2012-07-15 DIAGNOSIS — I2699 Other pulmonary embolism without acute cor pulmonale: Secondary | ICD-10-CM | POA: Diagnosis not present

## 2012-07-15 DIAGNOSIS — Z7901 Long term (current) use of anticoagulants: Secondary | ICD-10-CM | POA: Diagnosis not present

## 2012-07-21 DIAGNOSIS — M169 Osteoarthritis of hip, unspecified: Secondary | ICD-10-CM | POA: Diagnosis not present

## 2012-07-21 DIAGNOSIS — M5137 Other intervertebral disc degeneration, lumbosacral region: Secondary | ICD-10-CM | POA: Diagnosis not present

## 2012-08-25 DIAGNOSIS — Z7901 Long term (current) use of anticoagulants: Secondary | ICD-10-CM | POA: Diagnosis not present

## 2012-08-25 DIAGNOSIS — I2699 Other pulmonary embolism without acute cor pulmonale: Secondary | ICD-10-CM | POA: Diagnosis not present

## 2012-09-11 DIAGNOSIS — I10 Essential (primary) hypertension: Secondary | ICD-10-CM | POA: Diagnosis not present

## 2012-09-11 DIAGNOSIS — Z125 Encounter for screening for malignant neoplasm of prostate: Secondary | ICD-10-CM | POA: Diagnosis not present

## 2012-09-11 DIAGNOSIS — E785 Hyperlipidemia, unspecified: Secondary | ICD-10-CM | POA: Diagnosis not present

## 2012-09-18 ENCOUNTER — Encounter: Payer: Self-pay | Admitting: Internal Medicine

## 2012-09-18 DIAGNOSIS — Z0000 Encounter for general adult medical examination without abnormal findings: Secondary | ICD-10-CM | POA: Diagnosis not present

## 2012-09-18 DIAGNOSIS — Z23 Encounter for immunization: Secondary | ICD-10-CM | POA: Diagnosis not present

## 2012-09-18 DIAGNOSIS — E785 Hyperlipidemia, unspecified: Secondary | ICD-10-CM | POA: Diagnosis not present

## 2012-09-18 DIAGNOSIS — I10 Essential (primary) hypertension: Secondary | ICD-10-CM | POA: Diagnosis not present

## 2012-09-18 DIAGNOSIS — I2699 Other pulmonary embolism without acute cor pulmonale: Secondary | ICD-10-CM | POA: Diagnosis not present

## 2012-10-06 DIAGNOSIS — I2699 Other pulmonary embolism without acute cor pulmonale: Secondary | ICD-10-CM | POA: Diagnosis not present

## 2012-10-06 DIAGNOSIS — Z7901 Long term (current) use of anticoagulants: Secondary | ICD-10-CM | POA: Diagnosis not present

## 2012-10-21 ENCOUNTER — Ambulatory Visit (INDEPENDENT_AMBULATORY_CARE_PROVIDER_SITE_OTHER): Payer: Medicare Other | Admitting: Internal Medicine

## 2012-10-21 ENCOUNTER — Telehealth: Payer: Self-pay

## 2012-10-21 ENCOUNTER — Encounter: Payer: Self-pay | Admitting: Internal Medicine

## 2012-10-21 VITALS — BP 106/76 | HR 80 | Ht 68.0 in | Wt 211.0 lb

## 2012-10-21 DIAGNOSIS — K5900 Constipation, unspecified: Secondary | ICD-10-CM

## 2012-10-21 DIAGNOSIS — D689 Coagulation defect, unspecified: Secondary | ICD-10-CM

## 2012-10-21 DIAGNOSIS — K6289 Other specified diseases of anus and rectum: Secondary | ICD-10-CM | POA: Diagnosis not present

## 2012-10-21 DIAGNOSIS — K625 Hemorrhage of anus and rectum: Secondary | ICD-10-CM | POA: Diagnosis not present

## 2012-10-21 MED ORDER — MOVIPREP 100 G PO SOLR: 1.0000 | Freq: Once | ORAL | Status: DC

## 2012-10-21 NOTE — Patient Instructions (Addendum)
You have been scheduled for a colonoscopy with propofol. Please follow written instructions given to you at your visit today.  Please pick up your prep kit at the pharmacy within the next 1-3 days. If you use inhalers (even only as needed) or a CPAP machine, please bring them with you on the day of your procedure.  

## 2012-10-21 NOTE — Progress Notes (Signed)
HISTORY OF PRESENT ILLNESS:  Jake Adams is a 71 y.o. 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 The patient is sent today regarding endoscopy. He reports having had a flexible sigmoidoscopy approximately 10 years ago for rectal bleeding. He was diagnosed with internal hemorrhoids. More recently has been having some stinging and minor bleeding with defecation. Occasional constipation. His GI review of systems is otherwise negative. He has come off Coumadin previously for back surgery. Patient is accompanied today by his wife. Review of outside laboratories shows normal CBC and comprehensive metabolic panel from 09/11/2012.  REVIEW OF SYSTEMS:  All non-GI ROS negative except for anxiety, arthritis, back pain, hip pain, visual change, headaches, sleeping problems, swelling in the right leg  Past Medical History  Diagnosis Date  . Hypertension   . Dysrhythmia   . Coronary artery disease   . Pulmonary embolism   . Osteoarthritis   . Hyperlipidemia   . GERD (gastroesophageal reflux disease)   . PVC (premature ventricular contraction)   . ED (erectile dysfunction)     Past Surgical History  Procedure Date  . Hernia repair   . Joint replacement     Hip x 7; knee  . Eye surgery     B cataracts  . Knee surgery 1995    TKR     Social History Ned Card  reports that he has quit smoking. He has never used smokeless tobacco. He reports that he does not drink alcohol or use illicit drugs.  family history includes Breast cancer in his mother.  No Known Allergies     PHYSICAL EXAMINATION: Vital signs: BP 106/76  Pulse 80  Ht 5\' 8"  (1.727 m)  Wt 211 lb (95.709 kg)  BMI 32.08 kg/m2  Constitutional: generally well-appearing, no acute distress Psychiatric: alert and oriented x3, cooperative Eyes: extraocular movements intact, anicteric,  conjunctiva pink Mouth: oral pharynx moist, no lesions Neck: supple no lymphadenopathy Cardiovascular: heart regular rate and rhythm, Lungs: clear to auscultation bilaterally Abdomen: soft, nontender, nondistended, no obvious ascites, no peritoneal signs, normal bowel sounds, no organomegaly Rectal:deferred until colonoscopy Extremities: no lower extremity edema on the left. Nonpitting edema on the right Skin: no lesions on visible extremities Neuro: No focal deficits.   ASSESSMENT:  #1. Rectal stinging with minor bleeding likely due to hemorrhoids or fissure #2. Functional constipation #3. Colonoscopy. This to evaluate rectal bleeding and discomfort as well as constipation. In addition, provide appropriate colorectal neoplasia screening. He is an appropriate candidate without contraindication. High risk due to Coumadin related issues and history of pulmonary embolus, the remote was quite dramatic.   PLAN:  #1. Fiber supplement #2. Topical therapy #3. Colonoscopy.The nature of the procedure, as well as the risks, benefits, and alternatives were carefully and thoroughly reviewed with the patient. Ample time for discussion and questions allowed. The patient understood, was satisfied, and agreed to proceed.  #4. Movi prep prescribed. The patient was instructed on its use #5. Would like to hold Coumadin 3 days prior to the procedure. We will obtain approval from Dr. Jacky Kindle regarding this issue

## 2012-10-21 NOTE — Telephone Encounter (Signed)
  10/21/2012    RE: Jake Adams DOB: January 07, 1941 MRN: [id number]   Dear Dr. Jacky Kindle,    We have scheduled the above patient for an endoscopic procedure. Our records show that he is on anticoagulation therapy.   Please advise as to how long the patient may come off his therapy of Coumadin prior to the procedure, which is scheduled for 12-01-12.  Please fax back/ or route the completed form to [facility] at [phone].   Sincerely,  Gracy Racer

## 2012-11-18 DIAGNOSIS — I2699 Other pulmonary embolism without acute cor pulmonale: Secondary | ICD-10-CM | POA: Diagnosis not present

## 2012-11-18 DIAGNOSIS — Z7901 Long term (current) use of anticoagulants: Secondary | ICD-10-CM | POA: Diagnosis not present

## 2012-11-24 ENCOUNTER — Telehealth: Payer: Self-pay

## 2012-11-24 NOTE — Telephone Encounter (Signed)
Spoke with Misty Stanley at Dr. Lanell Matar office following up on lack of response to anticoagulation letter since 10-21-13.  Misty Stanley stated that Dr. Jacky Kindle and his staff were out of the office but would be back tomorrow.  I agreed to call back then

## 2012-11-25 ENCOUNTER — Other Ambulatory Visit (HOSPITAL_COMMUNITY): Payer: Self-pay | Admitting: Internal Medicine

## 2012-11-25 DIAGNOSIS — IMO0002 Reserved for concepts with insufficient information to code with codable children: Secondary | ICD-10-CM

## 2012-11-26 ENCOUNTER — Telehealth: Payer: Self-pay

## 2012-11-26 NOTE — Telephone Encounter (Signed)
Phone busy; need to communicate to patient that I got confirmation from Dr. Jacky Kindle for him to hold his Coumadin before his procedure.  Will continue to try

## 2012-11-26 NOTE — Telephone Encounter (Signed)
Spoke with patient and let him know that per Dr. Jacky Kindle, he could hold his Coumadin for 5 days prior to his procedure.  He asked several questions about prepping for the procedure and had me speak with his wife to go over it with them again.  I encouraged them to get the instructions out and refresh their memories on what to do.  They agreed

## 2012-11-27 ENCOUNTER — Ambulatory Visit (HOSPITAL_COMMUNITY)
Admission: RE | Admit: 2012-11-27 | Discharge: 2012-11-27 | Disposition: A | Discharge status: Home / Self Care | Payer: Medicare Other | Source: Ambulatory Visit | Attending: Internal Medicine | Admitting: Internal Medicine

## 2012-11-27 DIAGNOSIS — I10 Essential (primary) hypertension: Secondary | ICD-10-CM | POA: Diagnosis not present

## 2012-11-27 DIAGNOSIS — IMO0002 Reserved for concepts with insufficient information to code with codable children: Secondary | ICD-10-CM

## 2012-11-27 DIAGNOSIS — T148XXA Other injury of unspecified body region, initial encounter: Secondary | ICD-10-CM | POA: Diagnosis not present

## 2012-11-27 DIAGNOSIS — R609 Edema, unspecified: Secondary | ICD-10-CM | POA: Diagnosis not present

## 2012-11-27 DIAGNOSIS — Z7901 Long term (current) use of anticoagulants: Secondary | ICD-10-CM | POA: Diagnosis not present

## 2012-11-30 ENCOUNTER — Encounter: Payer: Self-pay | Admitting: Internal Medicine

## 2012-12-01 ENCOUNTER — Encounter: Payer: Self-pay | Admitting: Internal Medicine

## 2012-12-01 ENCOUNTER — Ambulatory Visit (AMBULATORY_SURGERY_CENTER): Payer: Medicare Other | Admitting: Internal Medicine

## 2012-12-01 VITALS — BP 122/67 | HR 74 | Temp 98.2°F | Resp 15 | Ht 68.0 in | Wt 211.0 lb

## 2012-12-01 DIAGNOSIS — D126 Benign neoplasm of colon, unspecified: Secondary | ICD-10-CM | POA: Diagnosis not present

## 2012-12-01 DIAGNOSIS — Z1211 Encounter for screening for malignant neoplasm of colon: Secondary | ICD-10-CM | POA: Diagnosis not present

## 2012-12-01 DIAGNOSIS — K621 Rectal polyp: Secondary | ICD-10-CM | POA: Diagnosis not present

## 2012-12-01 DIAGNOSIS — E785 Hyperlipidemia, unspecified: Secondary | ICD-10-CM | POA: Diagnosis not present

## 2012-12-01 DIAGNOSIS — K5900 Constipation, unspecified: Secondary | ICD-10-CM

## 2012-12-01 DIAGNOSIS — K625 Hemorrhage of anus and rectum: Secondary | ICD-10-CM | POA: Diagnosis not present

## 2012-12-01 DIAGNOSIS — C187 Malignant neoplasm of sigmoid colon: Secondary | ICD-10-CM | POA: Diagnosis not present

## 2012-12-01 DIAGNOSIS — I4949 Other premature depolarization: Secondary | ICD-10-CM | POA: Diagnosis not present

## 2012-12-01 DIAGNOSIS — I2510 Atherosclerotic heart disease of native coronary artery without angina pectoris: Secondary | ICD-10-CM | POA: Diagnosis not present

## 2012-12-01 DIAGNOSIS — I10 Essential (primary) hypertension: Secondary | ICD-10-CM | POA: Diagnosis not present

## 2012-12-01 MED ORDER — SODIUM CHLORIDE 0.9 % IV SOLN: 500.0000 mL | INTRAVENOUS | Status: DC

## 2012-12-01 NOTE — Progress Notes (Signed)
Called to room to assist during endoscopic procedure.  Patient ID and intended procedure confirmed with present staff. Received instructions for my participation in the procedure from the performing physician.  

## 2012-12-01 NOTE — Op Note (Signed)
[facility] [address] [place], [address]   COLONOSCOPY PROCEDURE REPORT  PATIENT: Jake, Adams  MR#: [id number] BIRTHDATE: 04/25/1941 , 71  yrs. old GENDER: Male ENDOSCOPIST: Roxy Cedar, MD REFERRED [id number] Jacky Kindle, M.D. PROCEDURE DATE:  12/01/2012 PROCEDURE:   Colonoscopy with snare polypectomy x 4 and Colonoscopy with hemostatic endoclip placement ASA CLASS:   Class III INDICATIONS:Rectal Bleeding. MEDICATIONS: MAC sedation, administered by CRNA and propofol (Diprivan) 250mg  IV  DESCRIPTION OF PROCEDURE:   After the risks benefits and alternatives of the procedure were thoroughly explained, informed consent was obtained.  A digital rectal exam revealed no abnormalities of the rectum.   The LB CF-[id number]  endoscope was introduced through the anus and advanced to the cecum, which was identified by both the appendix and ileocecal valve. No adverse events experienced.   The quality of the prep was adequate, using MoviPrep  The instrument was then slowly withdrawn as the colon was fully examined.    COLON FINDINGS: Three polyps ranging between 3-91mm in size were found in the descending colon (2) and rectum.  A polypectomy was performed with a cold snare.  The resection was complete and the polyp tissue was completely retrieved.   A pedunculated polyp measuring 25 mm in size was found in the sigmoid colon.  A polypectomy was performed using snare cautery.  The resection was complete and the polyp tissue was completely retrieved. An Endoclip was prophyllactically placed across resected stalk.   Mild diverticulosis was noted in the sigmoid colon.   The colon mucosa was otherwise normal.  Retroflexed views revealed internal hemorrhoids. The time to cecum=2 minutes 15 seconds.  Withdrawal time=16 minutes 20 seconds.  The scope was withdrawn and the procedure completed. COMPLICATIONS: There were no complications. ENDOSCOPIC IMPRESSION: 1.    Three polyps ranging between 3-9mm in size were found in the descending colon and rectum; polypectomy was performed with a cold snare 2.   Pedunculated polyp measuring 25 mm in size was found in the sigmoid colon; polypectomy was performed using snare cautery . Hemostatic clip placed prophylcactically 3.   Mild diverticulosis was noted in the sigmoid colon 4.   The colon mucosa was otherwise normal  RECOMMENDATIONS: 1.  Resume Coumadin (warfarin) in one week and have your PT/INR checked within 1 week. 2.  Repeat Colonoscopy in 3 years most likely.       eSigned:  Roxy Cedar, MD 12/01/2012 12:04 PM cc: Geoffry Paradise, MD and The Patient

## 2012-12-01 NOTE — Progress Notes (Signed)
Patient did not experience any of the following events: a burn prior to discharge; a fall within the facility; wrong site/side/patient/procedure/implant event; or a hospital transfer or hospital admission upon discharge from the facility. (G8907) Patient did not have preoperative order for IV antibiotic SSI prophylaxis. (G8918)  

## 2012-12-01 NOTE — Progress Notes (Signed)
Card given to pt. To identify that clip in place in sigmoid colon in case MRI is ordered.

## 2012-12-01 NOTE — Patient Instructions (Addendum)

## 2012-12-02 ENCOUNTER — Telehealth: Payer: Self-pay | Admitting: *Deleted

## 2012-12-02 NOTE — Telephone Encounter (Signed)
  Follow up Call-  Call back number 12/01/2012  Post procedure Call Back phone  # [phone]  Permission to leave phone message Yes     Patient questions:  Do you have a fever, pain , or abdominal swelling? no Pain Score  0 *  Have you tolerated food without any problems? yes  Have you been able to return to your normal activities? yes  Do you have any questions about your discharge instructions: Diet   no Medications  no Follow up visit  no  Do you have questions or concerns about your Care? no  Actions: * If pain score is 4 or above: No action needed, pain <4.

## 2012-12-07 DIAGNOSIS — Z7901 Long term (current) use of anticoagulants: Secondary | ICD-10-CM | POA: Diagnosis not present

## 2012-12-08 ENCOUNTER — Encounter: Payer: Self-pay | Admitting: Internal Medicine

## 2012-12-15 DIAGNOSIS — M169 Osteoarthritis of hip, unspecified: Secondary | ICD-10-CM | POA: Diagnosis not present

## 2012-12-30 DIAGNOSIS — Z7901 Long term (current) use of anticoagulants: Secondary | ICD-10-CM | POA: Diagnosis not present

## 2012-12-31 DIAGNOSIS — M169 Osteoarthritis of hip, unspecified: Secondary | ICD-10-CM | POA: Diagnosis not present

## 2013-01-13 DIAGNOSIS — Z7901 Long term (current) use of anticoagulants: Secondary | ICD-10-CM | POA: Diagnosis not present

## 2013-01-13 DIAGNOSIS — I2699 Other pulmonary embolism without acute cor pulmonale: Secondary | ICD-10-CM | POA: Diagnosis not present

## 2013-02-10 DIAGNOSIS — I2699 Other pulmonary embolism without acute cor pulmonale: Secondary | ICD-10-CM | POA: Diagnosis not present

## 2013-02-10 DIAGNOSIS — Z7901 Long term (current) use of anticoagulants: Secondary | ICD-10-CM | POA: Diagnosis not present

## 2013-02-22 DIAGNOSIS — H905 Unspecified sensorineural hearing loss: Secondary | ICD-10-CM | POA: Diagnosis not present

## 2013-02-22 DIAGNOSIS — H9319 Tinnitus, unspecified ear: Secondary | ICD-10-CM | POA: Diagnosis not present

## 2013-02-22 DIAGNOSIS — H6120 Impacted cerumen, unspecified ear: Secondary | ICD-10-CM | POA: Diagnosis not present

## 2013-03-02 ENCOUNTER — Other Ambulatory Visit: Payer: Self-pay | Admitting: Orthopedic Surgery

## 2013-03-02 MED ORDER — BUPIVACAINE LIPOSOME 1.3 % IJ SUSP: 20.0000 mL | Freq: Once | INTRAMUSCULAR | Status: DC

## 2013-03-02 MED ORDER — DEXAMETHASONE SODIUM PHOSPHATE 10 MG/ML IJ SOLN: 10.0000 mg | Freq: Once | INTRAMUSCULAR | Status: DC

## 2013-03-02 NOTE — Progress Notes (Signed)
Preoperative surgical orders have been place into the Epic hospital system for Ned Card on 03/02/2013, 5:43 PM  by Patrica Duel for surgery on 03/18/2013.  Preop Total Hip orders including Experel Injecion, IV Tylenol, and IV Decadron as long as there are no contraindications to the above medications. Avel Peace, PA-C

## 2013-03-05 ENCOUNTER — Encounter (HOSPITAL_COMMUNITY): Payer: Self-pay | Admitting: Pharmacy Technician

## 2013-03-08 DIAGNOSIS — T8489XA Other specified complication of internal orthopedic prosthetic devices, implants and grafts, initial encounter: Secondary | ICD-10-CM | POA: Diagnosis not present

## 2013-03-12 ENCOUNTER — Ambulatory Visit (HOSPITAL_COMMUNITY)
Admission: RE | Admit: 2013-03-12 | Discharge: 2013-03-12 | Disposition: A | Discharge status: Home / Self Care | Payer: Medicare Other | Source: Ambulatory Visit | Attending: Orthopedic Surgery | Admitting: Orthopedic Surgery

## 2013-03-12 ENCOUNTER — Encounter (HOSPITAL_COMMUNITY)
Admission: RE | Admit: 2013-03-12 | Discharge: 2013-03-12 | Disposition: A | Discharge status: Home / Self Care | Payer: Medicare Other | Source: Ambulatory Visit | Attending: Orthopedic Surgery | Admitting: Orthopedic Surgery

## 2013-03-12 ENCOUNTER — Encounter (HOSPITAL_COMMUNITY): Payer: Self-pay

## 2013-03-12 ENCOUNTER — Other Ambulatory Visit: Payer: Self-pay

## 2013-03-12 DIAGNOSIS — Z01812 Encounter for preprocedural laboratory examination: Secondary | ICD-10-CM | POA: Insufficient documentation

## 2013-03-12 DIAGNOSIS — I700 Atherosclerosis of aorta: Secondary | ICD-10-CM | POA: Diagnosis not present

## 2013-03-12 DIAGNOSIS — T84099A Other mechanical complication of unspecified internal joint prosthesis, initial encounter: Secondary | ICD-10-CM | POA: Diagnosis not present

## 2013-03-12 DIAGNOSIS — Z86711 Personal history of pulmonary embolism: Secondary | ICD-10-CM | POA: Insufficient documentation

## 2013-03-12 DIAGNOSIS — Z01810 Encounter for preprocedural cardiovascular examination: Secondary | ICD-10-CM | POA: Insufficient documentation

## 2013-03-12 DIAGNOSIS — Z96649 Presence of unspecified artificial hip joint: Secondary | ICD-10-CM | POA: Diagnosis not present

## 2013-03-12 DIAGNOSIS — Z01818 Encounter for other preprocedural examination: Secondary | ICD-10-CM | POA: Diagnosis not present

## 2013-03-12 DIAGNOSIS — Z7901 Long term (current) use of anticoagulants: Secondary | ICD-10-CM | POA: Diagnosis not present

## 2013-03-12 DIAGNOSIS — R911 Solitary pulmonary nodule: Secondary | ICD-10-CM | POA: Insufficient documentation

## 2013-03-12 DIAGNOSIS — Z0183 Encounter for blood typing: Secondary | ICD-10-CM | POA: Insufficient documentation

## 2013-03-12 HISTORY — DX: Fat embolism (traumatic), initial encounter: T79.1XXA

## 2013-03-12 HISTORY — DX: Headache: R51

## 2013-03-12 HISTORY — DX: Pain, unspecified: R52

## 2013-03-12 LAB — COMPREHENSIVE METABOLIC PANEL
Alkaline Phosphatase: 103 U/L (ref 39–117)
BUN: 8 mg/dL (ref 6–23)
CO2: 27 mEq/L (ref 19–32)
Chloride: 103 mEq/L (ref 96–112)
Creatinine, Ser: 0.73 mg/dL (ref 0.50–1.35)
GFR calc non Af Amer: >90 mL/min (ref >90)
Glucose, Bld: 93 mg/dL (ref 70–99)
Potassium: 4.7 mEq/L (ref 3.5–5.1)
Total Bilirubin: 0.9 mg/dL (ref 0.3–1.2)

## 2013-03-12 LAB — URINALYSIS, ROUTINE W REFLEX MICROSCOPIC
Bilirubin Urine: NEGATIVE
Glucose, UA: NEGATIVE mg/dL
Ketones, ur: NEGATIVE mg/dL
Leukocytes, UA: NEGATIVE
Nitrite: NEGATIVE
Specific Gravity, Urine: 1.009 (ref 1.005–1.030)
pH: 6.5 (ref 5.0–8.0)

## 2013-03-12 LAB — CBC
HCT: 40.9 % (ref 39.0–52.0)
Hemoglobin: 13.6 g/dL (ref 13.0–17.0)
MCV: 82.5 fL (ref 78.0–100.0)
RBC: 4.96 MIL/uL (ref 4.22–5.81)
WBC: 5 10*3/uL (ref 4.0–10.5)

## 2013-03-12 LAB — APTT: aPTT: 38 seconds — ABNORMAL HIGH (ref 24–37)

## 2013-03-12 LAB — PROTIME-INR
INR: 2.04 — ABNORMAL HIGH (ref 0.00–1.49)
Prothrombin Time: 22.2 seconds — ABNORMAL HIGH (ref 11.6–15.2)

## 2013-03-12 NOTE — Pre-Procedure Instructions (Signed)
PT'S PREOP PT22.2. INR 2.04 - REPORT FAXED TO DR. Deri Fuelling OFFICE WITH NOTE THAT PT STATES HIS LAST COUMADIN IS TONIGHT - THEN STOPPING FOR SURGERY--ORDER IS IN EPIC TO REPEAT PT, INR ON ARRIVAL FOR SURGERY. PT'S PREOP CHEST XRAY ABNORMAL -REPORT STATES RADIOLOGY ASSISTANT WILL NOTIFY DR. Lequita Halt OR HIS REPRESENTATIVE.  COPY OF THE CXR REPORT FAXED TO DR. ALUISIO'S OFFICE.

## 2013-03-12 NOTE — Pre-Procedure Instructions (Signed)
EKG, CXR, LEFT HIP XRAY WERE DONE TODAY A T [facility] PREOP.

## 2013-03-12 NOTE — Patient Instructions (Signed)
YOUR SURGERY IS SCHEDULED AT [facility]  ON:  Thursday  5/15  REPORT TO [facility] AT:  10:00 AM      PHONE # FOR SHORT STAY IS [phone]  DO NOT EAT OR DRINK ANYTHING AFTER MIDNIGHT THE NIGHT BEFORE YOUR SURGERY.  YOU MAY BRUSH YOUR TEETH, RINSE OUT YOUR MOUTH--BUT NO WATER, NO FOOD, NO CHEWING GUM, NO MINTS, NO CANDIES, NO CHEWING TOBACCO.  PLEASE TAKE THE FOLLOWING MEDICATIONS THE AM OF YOUR SURGERY WITH A FEW SIPS OF WATER:  ATENOLOL, PANTOPRAZOLE, HYDROCODONE / ACETAMINOPHEN    DO NOT BRING VALUABLES, MONEY, CREDIT CARDS.  DO NOT WEAR JEWELRY, MAKE-UP, NAIL POLISH AND NO METAL PINS OR CLIPS IN YOUR HAIR. CONTACT LENS, DENTURES / PARTIALS, GLASSES SHOULD NOT BE WORN TO SURGERY AND IN MOST CASES-HEARING AIDS WILL NEED TO BE REMOVED.  BRING YOUR GLASSES CASE, ANY EQUIPMENT NEEDED FOR YOUR CONTACT LENS. FOR PATIENTS ADMITTED TO THE HOSPITAL--CHECK OUT TIME THE DAY OF DISCHARGE IS 11:00 AM.  ALL INPATIENT ROOMS ARE PRIVATE - WITH BATHROOM, TELEPHONE, TELEVISION AND WIFI INTERNET.                            PLEASE READ OVER ANY  FACT SHEETS THAT YOU WERE GIVEN: MRSA INFORMATION, BLOOD TRANSFUSION INFORMATION, INCENTIVE SPIROMETER INFORMATION. FAILURE TO FOLLOW THESE INSTRUCTIONS MAY RESULT IN THE CANCELLATION OF YOUR SURGERY.   PATIENT SIGNATURE_________________________________

## 2013-03-14 NOTE — H&P (Signed)
TOTAL HIP REVISION ADMISSION H&P  Patient is admitted for left revision total hip arthroplasty.  Subjective:  Chief Complaint: left hip pain  HPI: Jake Adams, 72 y.o. male, has a history of pain and functional disability in the left hip due to arthritis and patient has failed non-surgical conservative treatments for greater than 12 weeks to include NSAID's and/or analgesics, use of assistive devices and activity modification. The indications for the revision total hip arthroplasty are loosening of one or more components.  Onset of symptoms was gradual starting >10 years ago with gradually worsening course since that time.  Prior procedures on the left hip include arthroplasty.  Patient currently rates pain in the left hip at 8 out of 10 with activity.  There is night pain, worsening of pain with activity and weight bearing, pain that interfers with activities of daily living and pain with passive range of motion. Patient has evidence of periarticular osteophytes and prosthetic loosening by imaging studies.  This condition presents safety issues increasing the risk of falls.  There is no current active infection.  Patient Active Problem List   Diagnosis Date Noted  . Pulmonary embolism    Past Medical History  Diagnosis Date  . Hypertension   . Dysrhythmia   . Pulmonary embolism 08/2010  . Osteoarthritis   . GERD (gastroesophageal reflux disease)   . PVC (premature ventricular contraction)   . ED (erectile dysfunction)   . Anxiety   . Hyperlipidemia   . Headache     HX OF MIGRAINES  . Pain     SEVERE BACK PAIN -STATES KYPHOPLASTY DID NOT HELP HIS PAIN  . Fat embolus 1995    POST OP    Past Surgical History  Procedure Laterality Date  . Hernia repair    . Eye surgery      B cataracts  . Knee surgery  1995    TKR   . Fixation kyphoplasty lumbar spine  04/24/12  . Joint replacement      LEFT TOTAL HIP ARTHROPLASTY AND 6 REVISIONS-LAST WAS 2006;  RIGHT TOTAL KNEE ARTHROPLASTY  -1995     Current outpatient prescriptions:atenolol (TENORMIN) 50 MG tablet, Take 50 mg by mouth 2 (two) times daily., Disp: , Rfl: ;  clotrimazole-betamethasone (LOTRISONE) cream, Apply 1 application topically 2 (two) times daily. , Disp: , Rfl: ;  HYDROcodone-acetaminophen (NORCO) 7.5-325 MG per tablet, Take 1 tablet by mouth every 6 (six) hours as needed. Take one to two tablets by mouth every 4-6 hours as needed for pain, Disp: , Rfl:  hydrocortisone (ANUSOL-HC) 2.5 % rectal cream, Place 1 application rectally 2 (two) times daily., Disp: , Rfl: ;  LORazepam (ATIVAN) 1 MG tablet, Take 1.5 mg by mouth at bedtime. For anxiety, Disp: , Rfl: ;  pantoprazole (PROTONIX) 40 MG tablet, Take 40 mg by mouth daily., Disp: , Rfl: ;  pravastatin (PRAVACHOL) 40 MG tablet, Take 40 mg by mouth every evening. , Disp: , Rfl:  traMADol (ULTRAM) 50 MG tablet, Take 50 mg by mouth every 6 (six) hours as needed. For pain, Disp: , Rfl: ;  warfarin (COUMADIN) 2 MG tablet, Take 4 mg by mouth daily. , Disp: , Rfl:   Allergies  Allergen Reactions  . Warfarin And Related     CAUSED SEVERE DIARRHEA-- PT IS ABLE TO TAKE AND DOES TAKE COUMADIN    History  Substance Use Topics  . Smoking status: Former Games developer  . Smokeless tobacco: Never Used     Comment: QUIT  SMOKING AT AGE 5  . Alcohol Use: No    Family History  Problem Relation Age of Onset  . Breast cancer Mother       Review of Systems  Constitutional: Negative.   HENT: Positive for tinnitus. Negative for hearing loss, ear pain, nosebleeds, congestion, sore throat, neck pain and ear discharge.   Eyes: Negative.   Respiratory: Negative.  Negative for stridor.   Cardiovascular: Negative.   Gastrointestinal: Negative.   Genitourinary: Negative.   Musculoskeletal: Positive for back pain and joint pain. Negative for myalgias and falls.       Left hip pain  Skin: Negative.   Neurological: Negative.  Negative for headaches.  Endo/Heme/Allergies: Negative.    Psychiatric/Behavioral: Negative.     Objective:  Physical Exam  Constitutional: He is oriented to person, place, and time. He appears well-developed and well-nourished. No distress.  HENT:  Head: Normocephalic and atraumatic.  Right Ear: External ear normal.  Left Ear: External ear normal.  Nose: Nose normal.  Mouth/Throat: Oropharynx is clear and moist.  Eyes: Conjunctivae and EOM are normal.  Neck: Normal range of motion. Neck supple. No tracheal deviation present. No thyromegaly present.  Cardiovascular: Normal rate, regular rhythm, normal heart sounds and intact distal pulses.   No murmur heard. Respiratory: Effort normal. No respiratory distress. He has decreased breath sounds. He has no wheezes. He exhibits no tenderness.  GI: Soft. Bowel sounds are normal. He exhibits no distension and no mass. There is no tenderness.  Musculoskeletal:       Right hip: He exhibits decreased range of motion and decreased strength.       Left hip: He exhibits decreased range of motion and decreased strength.       Right knee: Normal.       Left knee: Normal.       Right lower leg: He exhibits no tenderness and no swelling.       Left lower leg: He exhibits no tenderness and no swelling.  His left hip can be flexed to about 90, rotate about 10 to 20 in each direction, abduct 30 with discomfort.    Lymphadenopathy:    He has no cervical adenopathy.  Neurological: He is alert and oriented to person, place, and time. He has normal strength and normal reflexes. No sensory deficit.  Skin: No rash noted. He is not diaphoretic. No erythema.  Psychiatric: He has a normal mood and affect. His behavior is normal.    Vitals Pulse: 72 (Regular) BP: 118/72 (Sitting, Left Arm, Standard)   Estimated body mass index is 32.09 kg/(m^2) as calculated from the following:   Height as of 12/01/12: 5\' 8"  (1.727 m).   Weight as of 12/01/12: 95.709 kg (211 lb).  Imaging Review:  Plain radiographs  demonstrate severe degenerative joint disease of the left hip(s). There is evidence of loosening of the acetabular cup and femoral stem.The bone quality appears to be poor for age and reported activity level.   Assessment/Plan:  End stage arthritis, left hip(s) with failed previous arthroplasty.  The patient history, physical examination, clinical judgement of the provider and imaging studies are consistent with end stage degenerative joint disease of the left hip(s), previous total hip arthroplasty. Revision total hip arthroplasty is deemed medically necessary. The treatment options including medical management, injection therapy, arthroscopy and arthroplasty were discussed at length. The risks and benefits of total hip arthroplasty were presented and reviewed. The risks due to aseptic loosening, infection, stiffness, dislocation/subluxation,  thromboembolic complications  and other imponderables were discussed.  The patient acknowledged the explanation, agreed to proceed with the plan and consent was signed. Patient is being admitted for inpatient treatment for surgery, pain control, PT, OT, prophylactic antibiotics, VTE prophylaxis, progressive ambulation and ADL's and discharge planning. The patient is planning to be discharged home with home health services   [place], [place]

## 2013-03-15 ENCOUNTER — Other Ambulatory Visit: Payer: Self-pay | Admitting: Orthopedic Surgery

## 2013-03-15 ENCOUNTER — Ambulatory Visit
Admission: RE | Admit: 2013-03-15 | Discharge: 2013-03-15 | Disposition: A | Discharge status: Home / Self Care | Payer: Medicare Other | Source: Ambulatory Visit | Attending: Orthopedic Surgery | Admitting: Orthopedic Surgery

## 2013-03-15 DIAGNOSIS — R9389 Abnormal findings on diagnostic imaging of other specified body structures: Secondary | ICD-10-CM

## 2013-03-15 DIAGNOSIS — J984 Other disorders of lung: Secondary | ICD-10-CM | POA: Diagnosis not present

## 2013-03-17 DIAGNOSIS — Z1331 Encounter for screening for depression: Secondary | ICD-10-CM | POA: Diagnosis not present

## 2013-03-17 DIAGNOSIS — M1990 Unspecified osteoarthritis, unspecified site: Secondary | ICD-10-CM | POA: Diagnosis not present

## 2013-03-17 DIAGNOSIS — E785 Hyperlipidemia, unspecified: Secondary | ICD-10-CM | POA: Diagnosis not present

## 2013-03-17 DIAGNOSIS — I10 Essential (primary) hypertension: Secondary | ICD-10-CM | POA: Diagnosis not present

## 2013-03-17 DIAGNOSIS — I4949 Other premature depolarization: Secondary | ICD-10-CM | POA: Diagnosis not present

## 2013-03-17 DIAGNOSIS — K219 Gastro-esophageal reflux disease without esophagitis: Secondary | ICD-10-CM | POA: Diagnosis not present

## 2013-03-17 DIAGNOSIS — Z6829 Body mass index (BMI) 29.0-29.9, adult: Secondary | ICD-10-CM | POA: Diagnosis not present

## 2013-03-17 NOTE — Pre-Procedure Instructions (Signed)
RESULTS OF PT'S CT CHEST REPORT ARE IN EPIC.

## 2013-03-18 ENCOUNTER — Inpatient Hospital Stay (HOSPITAL_COMMUNITY)
Admission: RE | Admit: 2013-03-18 | Discharge: 2013-03-20 | DRG: 467 | Disposition: A | Discharge status: Home Health | Payer: Medicare Other | Source: Ambulatory Visit | Attending: Orthopedic Surgery | Admitting: Orthopedic Surgery

## 2013-03-18 ENCOUNTER — Inpatient Hospital Stay (HOSPITAL_COMMUNITY): Payer: Medicare Other

## 2013-03-18 ENCOUNTER — Ambulatory Visit (HOSPITAL_COMMUNITY): Payer: Medicare Other | Admitting: Anesthesiology

## 2013-03-18 ENCOUNTER — Encounter (HOSPITAL_COMMUNITY)
Admission: RE | Disposition: A | Discharge status: Home Health | Payer: Self-pay | Source: Ambulatory Visit | Attending: Orthopedic Surgery

## 2013-03-18 ENCOUNTER — Encounter (HOSPITAL_COMMUNITY): Payer: Self-pay | Admitting: *Deleted

## 2013-03-18 ENCOUNTER — Encounter (HOSPITAL_COMMUNITY): Payer: Self-pay | Admitting: Anesthesiology

## 2013-03-18 DIAGNOSIS — I10 Essential (primary) hypertension: Secondary | ICD-10-CM | POA: Diagnosis present

## 2013-03-18 DIAGNOSIS — Z7901 Long term (current) use of anticoagulants: Secondary | ICD-10-CM

## 2013-03-18 DIAGNOSIS — Z471 Aftercare following joint replacement surgery: Secondary | ICD-10-CM | POA: Diagnosis not present

## 2013-03-18 DIAGNOSIS — K219 Gastro-esophageal reflux disease without esophagitis: Secondary | ICD-10-CM | POA: Diagnosis not present

## 2013-03-18 DIAGNOSIS — T84018D Broken internal joint prosthesis, other site, subsequent encounter: Secondary | ICD-10-CM

## 2013-03-18 DIAGNOSIS — Y831 Surgical operation with implant of artificial internal device as the cause of abnormal reaction of the patient, or of later complication, without mention of misadventure at the time of the procedure: Secondary | ICD-10-CM | POA: Diagnosis present

## 2013-03-18 DIAGNOSIS — M1610 Unilateral primary osteoarthritis, unspecified hip: Secondary | ICD-10-CM | POA: Diagnosis present

## 2013-03-18 DIAGNOSIS — I2699 Other pulmonary embolism without acute cor pulmonale: Secondary | ICD-10-CM

## 2013-03-18 DIAGNOSIS — E785 Hyperlipidemia, unspecified: Secondary | ICD-10-CM | POA: Diagnosis present

## 2013-03-18 DIAGNOSIS — Z87891 Personal history of nicotine dependence: Secondary | ICD-10-CM | POA: Diagnosis not present

## 2013-03-18 DIAGNOSIS — T84039A Mechanical loosening of unspecified internal prosthetic joint, initial encounter: Secondary | ICD-10-CM | POA: Diagnosis not present

## 2013-03-18 DIAGNOSIS — F411 Generalized anxiety disorder: Secondary | ICD-10-CM | POA: Diagnosis present

## 2013-03-18 DIAGNOSIS — Z96649 Presence of unspecified artificial hip joint: Secondary | ICD-10-CM | POA: Diagnosis not present

## 2013-03-18 DIAGNOSIS — M169 Osteoarthritis of hip, unspecified: Secondary | ICD-10-CM | POA: Diagnosis present

## 2013-03-18 DIAGNOSIS — I2782 Chronic pulmonary embolism: Secondary | ICD-10-CM | POA: Diagnosis not present

## 2013-03-18 DIAGNOSIS — T84018A Broken internal joint prosthesis, other site, initial encounter: Secondary | ICD-10-CM

## 2013-03-18 HISTORY — PX: TOTAL HIP REVISION: SHX763

## 2013-03-18 LAB — TYPE AND SCREEN

## 2013-03-18 LAB — PROTIME-INR
INR: 1.05 (ref 0.00–1.49)
Prothrombin Time: 13.6 seconds (ref 11.6–15.2)

## 2013-03-18 SURGERY — TOTAL HIP REVISION
Anesthesia: General | Site: Hip | Laterality: Left | Wound class: Clean

## 2013-03-18 MED ORDER — VANCOMYCIN HCL IN DEXTROSE 1-5 GM/200ML-% IV SOLN
INTRAVENOUS | Status: AC
  Filled 2013-03-18: qty 200

## 2013-03-18 MED ORDER — SODIUM CHLORIDE 0.9 % IV SOLN
INTRAVENOUS | Status: DC
  Administered 2013-03-18: 1000 mL via INTRAVENOUS

## 2013-03-18 MED ORDER — VANCOMYCIN HCL IN DEXTROSE 1-5 GM/200ML-% IV SOLN
1000.0000 mg | Freq: Once | INTRAVENOUS | Status: AC
  Administered 2013-03-18: 1000 mg via INTRAVENOUS

## 2013-03-18 MED ORDER — ONDANSETRON HCL 4 MG/2ML IJ SOLN: 4.0000 mg | Freq: Four times a day (QID) | INTRAMUSCULAR | Status: DC | PRN

## 2013-03-18 MED ORDER — DEXAMETHASONE SODIUM PHOSPHATE 10 MG/ML IJ SOLN
10.0000 mg | Freq: Every day | INTRAMUSCULAR | Status: AC
  Administered 2013-03-19: 10 mg via INTRAVENOUS
  Filled 2013-03-18 (×2): qty 1

## 2013-03-18 MED ORDER — METOCLOPRAMIDE HCL 10 MG PO TABS: 5.0000 mg | ORAL_TABLET | Freq: Three times a day (TID) | ORAL | Status: DC | PRN

## 2013-03-18 MED ORDER — NEOSTIGMINE METHYLSULFATE 1 MG/ML IJ SOLN
INTRAMUSCULAR | Status: DC | PRN
  Administered 2013-03-18: 1 mg via INTRAVENOUS

## 2013-03-18 MED ORDER — FENTANYL CITRATE 0.05 MG/ML IJ SOLN
25.0000 ug | INTRAMUSCULAR | Status: DC | PRN
  Administered 2013-03-18 (×2): 50 ug via INTRAVENOUS

## 2013-03-18 MED ORDER — HYDROCORTISONE 2.5 % RE CREA
1.0000 "application " | TOPICAL_CREAM | Freq: Two times a day (BID) | RECTAL | Status: DC
  Administered 2013-03-19: 1 via RECTAL
  Filled 2013-03-18 (×2): qty 28.35

## 2013-03-18 MED ORDER — TRAMADOL HCL 50 MG PO TABS: 50.0000 mg | ORAL_TABLET | Freq: Four times a day (QID) | ORAL | Status: DC | PRN

## 2013-03-18 MED ORDER — PROMETHAZINE HCL 25 MG/ML IJ SOLN: 6.2500 mg | INTRAMUSCULAR | Status: DC | PRN

## 2013-03-18 MED ORDER — PROPOFOL 10 MG/ML IV BOLUS
INTRAVENOUS | Status: DC | PRN
  Administered 2013-03-18: 150 mg via INTRAVENOUS

## 2013-03-18 MED ORDER — DIPHENHYDRAMINE HCL 12.5 MG/5ML PO ELIX: 12.5000 mg | ORAL_SOLUTION | ORAL | Status: DC | PRN

## 2013-03-18 MED ORDER — EPHEDRINE SULFATE 50 MG/ML IJ SOLN
INTRAMUSCULAR | Status: DC | PRN
  Administered 2013-03-18 (×5): 5 mg via INTRAVENOUS
  Administered 2013-03-18: 10 mg via INTRAVENOUS

## 2013-03-18 MED ORDER — OXYCODONE HCL 5 MG PO TABS
5.0000 mg | ORAL_TABLET | ORAL | Status: DC | PRN
  Administered 2013-03-19 (×3): 5 mg via ORAL
  Filled 2013-03-18 (×3): qty 1

## 2013-03-18 MED ORDER — CEFAZOLIN SODIUM-DEXTROSE 2-3 GM-% IV SOLR
INTRAVENOUS | Status: AC
  Filled 2013-03-18: qty 50

## 2013-03-18 MED ORDER — BISACODYL 10 MG RE SUPP: 10.0000 mg | Freq: Every day | RECTAL | Status: DC | PRN

## 2013-03-18 MED ORDER — FENTANYL CITRATE 0.05 MG/ML IJ SOLN
INTRAMUSCULAR | Status: AC
  Filled 2013-03-18: qty 2

## 2013-03-18 MED ORDER — METHOCARBAMOL 500 MG PO TABS
500.0000 mg | ORAL_TABLET | Freq: Four times a day (QID) | ORAL | Status: DC | PRN
  Administered 2013-03-19 – 2013-03-20 (×2): 500 mg via ORAL
  Filled 2013-03-18 (×2): qty 1

## 2013-03-18 MED ORDER — SIMVASTATIN 20 MG PO TABS
20.0000 mg | ORAL_TABLET | Freq: Every day | ORAL | Status: DC
  Administered 2013-03-18 – 2013-03-19 (×2): 20 mg via ORAL
  Filled 2013-03-18 (×4): qty 1

## 2013-03-18 MED ORDER — ONDANSETRON HCL 4 MG PO TABS: 4.0000 mg | ORAL_TABLET | Freq: Four times a day (QID) | ORAL | Status: DC | PRN

## 2013-03-18 MED ORDER — LACTATED RINGERS IV SOLN
INTRAVENOUS | Status: DC | PRN
  Administered 2013-03-18 (×3): via INTRAVENOUS

## 2013-03-18 MED ORDER — ACETAMINOPHEN 10 MG/ML IV SOLN
1000.0000 mg | Freq: Four times a day (QID) | INTRAVENOUS | Status: AC
  Administered 2013-03-18 – 2013-03-19 (×4): 1000 mg via INTRAVENOUS
  Filled 2013-03-18 (×7): qty 100

## 2013-03-18 MED ORDER — SODIUM CHLORIDE 0.9 % IJ SOLN
INTRAMUSCULAR | Status: DC | PRN
  Administered 2013-03-18: 16:00:00

## 2013-03-18 MED ORDER — WARFARIN SODIUM 6 MG PO TABS
6.0000 mg | ORAL_TABLET | Freq: Once | ORAL | Status: AC
  Administered 2013-03-18: 6 mg via ORAL
  Filled 2013-03-18: qty 1

## 2013-03-18 MED ORDER — BUPIVACAINE LIPOSOME 1.3 % IJ SUSP
20.0000 mL | Freq: Once | INTRAMUSCULAR | Status: DC
  Filled 2013-03-18: qty 20

## 2013-03-18 MED ORDER — SODIUM CHLORIDE 0.9 % IV SOLN
INTRAVENOUS | Status: DC
  Administered 2013-03-18 – 2013-03-19 (×2): via INTRAVENOUS

## 2013-03-18 MED ORDER — DOCUSATE SODIUM 100 MG PO CAPS
100.0000 mg | ORAL_CAPSULE | Freq: Two times a day (BID) | ORAL | Status: DC
  Administered 2013-03-18 – 2013-03-20 (×4): 100 mg via ORAL

## 2013-03-18 MED ORDER — MENTHOL 3 MG MT LOZG
1.0000 | LOZENGE | OROMUCOSAL | Status: DC | PRN
  Administered 2013-03-19: 3 mg via ORAL
  Filled 2013-03-18 (×2): qty 9

## 2013-03-18 MED ORDER — MEPERIDINE HCL 50 MG/ML IJ SOLN: 6.2500 mg | INTRAMUSCULAR | Status: DC | PRN

## 2013-03-18 MED ORDER — SUCCINYLCHOLINE CHLORIDE 20 MG/ML IJ SOLN
INTRAMUSCULAR | Status: DC | PRN
  Administered 2013-03-18: 100 mg via INTRAVENOUS

## 2013-03-18 MED ORDER — WARFARIN - PHARMACIST DOSING INPATIENT: Freq: Every day | Status: DC

## 2013-03-18 MED ORDER — FLEET ENEMA 7-19 GM/118ML RE ENEM: 1.0000 | ENEMA | Freq: Once | RECTAL | Status: AC | PRN

## 2013-03-18 MED ORDER — METOCLOPRAMIDE HCL 5 MG/ML IJ SOLN: 5.0000 mg | Freq: Three times a day (TID) | INTRAMUSCULAR | Status: DC | PRN

## 2013-03-18 MED ORDER — ENOXAPARIN SODIUM 30 MG/0.3ML ~~LOC~~ SOLN
30.0000 mg | Freq: Two times a day (BID) | SUBCUTANEOUS | Status: DC
  Administered 2013-03-19 – 2013-03-20 (×3): 30 mg via SUBCUTANEOUS
  Filled 2013-03-18 (×6): qty 0.3

## 2013-03-18 MED ORDER — CHLORHEXIDINE GLUCONATE 4 % EX LIQD
60.0000 mL | Freq: Once | CUTANEOUS | Status: DC
  Filled 2013-03-18: qty 60

## 2013-03-18 MED ORDER — PHENOL 1.4 % MT LIQD: 1.0000 | OROMUCOSAL | Status: DC | PRN

## 2013-03-18 MED ORDER — ATENOLOL 50 MG PO TABS
50.0000 mg | ORAL_TABLET | Freq: Two times a day (BID) | ORAL | Status: DC
Start: 2013-03-18 — End: 2013-03-20
  Administered 2013-03-18 – 2013-03-20 (×4): 50 mg via ORAL
  Filled 2013-03-18 (×7): qty 1

## 2013-03-18 MED ORDER — ACETAMINOPHEN 10 MG/ML IV SOLN
1000.0000 mg | Freq: Once | INTRAVENOUS | Status: AC
  Administered 2013-03-18: 1000 mg via INTRAVENOUS

## 2013-03-18 MED ORDER — METHOCARBAMOL 100 MG/ML IJ SOLN
500.0000 mg | Freq: Four times a day (QID) | INTRAVENOUS | Status: DC | PRN
  Filled 2013-03-18: qty 5

## 2013-03-18 MED ORDER — ROCURONIUM BROMIDE 100 MG/10ML IV SOLN
INTRAVENOUS | Status: DC | PRN
  Administered 2013-03-18: 30 mg via INTRAVENOUS

## 2013-03-18 MED ORDER — ACETAMINOPHEN 650 MG RE SUPP: 650.0000 mg | Freq: Four times a day (QID) | RECTAL | Status: DC | PRN

## 2013-03-18 MED ORDER — LACTATED RINGERS IV SOLN: INTRAVENOUS | Status: DC

## 2013-03-18 MED ORDER — PANTOPRAZOLE SODIUM 40 MG PO TBEC
40.0000 mg | DELAYED_RELEASE_TABLET | Freq: Every day | ORAL | Status: DC
  Administered 2013-03-18 – 2013-03-20 (×3): 40 mg via ORAL
  Filled 2013-03-18 (×4): qty 1

## 2013-03-18 MED ORDER — CEFAZOLIN SODIUM-DEXTROSE 2-3 GM-% IV SOLR
2.0000 g | Freq: Four times a day (QID) | INTRAVENOUS | Status: AC
  Administered 2013-03-18 – 2013-03-19 (×2): 2 g via INTRAVENOUS
  Filled 2013-03-18 (×2): qty 50

## 2013-03-18 MED ORDER — 0.9 % SODIUM CHLORIDE (POUR BTL) OPTIME
TOPICAL | Status: DC | PRN
  Administered 2013-03-18: 1000 mL

## 2013-03-18 MED ORDER — DEXAMETHASONE 6 MG PO TABS
10.0000 mg | ORAL_TABLET | Freq: Every day | ORAL | Status: AC
  Filled 2013-03-18: qty 1

## 2013-03-18 MED ORDER — LORAZEPAM 1 MG PO TABS
1.5000 mg | ORAL_TABLET | Freq: Every day | ORAL | Status: DC
  Administered 2013-03-18 – 2013-03-19 (×2): 1.5 mg via ORAL
  Filled 2013-03-18 (×2): qty 2

## 2013-03-18 MED ORDER — BUPIVACAINE HCL (PF) 0.25 % IJ SOLN
INTRAMUSCULAR | Status: AC
  Filled 2013-03-18: qty 30

## 2013-03-18 MED ORDER — ACETAMINOPHEN 10 MG/ML IV SOLN
INTRAVENOUS | Status: AC
  Filled 2013-03-18: qty 100

## 2013-03-18 MED ORDER — MORPHINE SULFATE 2 MG/ML IJ SOLN: 1.0000 mg | INTRAMUSCULAR | Status: DC | PRN

## 2013-03-18 MED ORDER — GLYCOPYRROLATE 0.2 MG/ML IJ SOLN
INTRAMUSCULAR | Status: DC | PRN
  Administered 2013-03-18: .2 mg via INTRAVENOUS

## 2013-03-18 MED ORDER — FENTANYL CITRATE 0.05 MG/ML IJ SOLN
INTRAMUSCULAR | Status: DC | PRN
  Administered 2013-03-18 (×3): 50 ug via INTRAVENOUS
  Administered 2013-03-18: 100 ug via INTRAVENOUS

## 2013-03-18 MED ORDER — CEFAZOLIN SODIUM-DEXTROSE 2-3 GM-% IV SOLR
2.0000 g | INTRAVENOUS | Status: AC
  Administered 2013-03-18: 2 g via INTRAVENOUS

## 2013-03-18 MED ORDER — POLYETHYLENE GLYCOL 3350 17 G PO PACK: 17.0000 g | PACK | Freq: Every day | ORAL | Status: DC | PRN

## 2013-03-18 MED ORDER — ACETAMINOPHEN 325 MG PO TABS: 650.0000 mg | ORAL_TABLET | Freq: Four times a day (QID) | ORAL | Status: DC | PRN

## 2013-03-18 SURGICAL SUPPLY — 70 items
BAG SPEC THK2 15X12 ZIP CLS (MISCELLANEOUS)
BAG ZIPLOCK 12X15 (MISCELLANEOUS) ×3 IMPLANT
BIT DRILL 2.8X128 (BIT) ×2 IMPLANT
BLADE EXTENDED COATED 6.5IN (ELECTRODE) ×2 IMPLANT
BLADE SAW SAG 73X25 THK (BLADE) ×1
BLADE SAW SGTL 73X25 THK (BLADE) ×1 IMPLANT
CLOTH BEACON ORANGE TIMEOUT ST (SAFETY) ×2 IMPLANT
CONT SPECI 4OZ STER CLIK (MISCELLANEOUS) IMPLANT
CUP PINNACLE SZ 62MM ×1 IMPLANT
DRAPE INCISE IOBAN 66X45 STRL (DRAPES) ×2 IMPLANT
DRAPE ORTHO SPLIT 77X108 STRL (DRAPES) ×4
DRAPE POUCH INSTRU U-SHP 10X18 (DRAPES) ×2 IMPLANT
DRAPE SURG ORHT 6 SPLT 77X108 (DRAPES) ×2 IMPLANT
DRAPE U-SHAPE 47X51 STRL (DRAPES) ×2 IMPLANT
DRSG EMULSION OIL 3X16 NADH (GAUZE/BANDAGES/DRESSINGS) ×2 IMPLANT
DRSG MEPILEX BORDER 4X12 (GAUZE/BANDAGES/DRESSINGS) ×1 IMPLANT
DRSG MEPILEX BORDER 4X4 (GAUZE/BANDAGES/DRESSINGS) ×2 IMPLANT
DRSG MEPILEX BORDER 4X8 (GAUZE/BANDAGES/DRESSINGS) ×1 IMPLANT
DURAPREP 26ML APPLICATOR (WOUND CARE) ×2 IMPLANT
ELECT REM PT RETURN 9FT ADLT (ELECTROSURGICAL) ×2
ELECTRODE REM PT RTRN 9FT ADLT (ELECTROSURGICAL) ×1 IMPLANT
EVACUATOR 1/8 PVC DRAIN (DRAIN) ×2 IMPLANT
FACESHIELD LNG OPTICON STERILE (SAFETY) ×9 IMPLANT
GLOVE BIO SURGEON STRL SZ7.5 (GLOVE) ×2 IMPLANT
GLOVE BIO SURGEON STRL SZ8 (GLOVE) ×2 IMPLANT
GLOVE BIOGEL PI IND STRL 6.5 (GLOVE) IMPLANT
GLOVE BIOGEL PI IND STRL 8 (GLOVE) ×2 IMPLANT
GLOVE BIOGEL PI INDICATOR 6.5 (GLOVE) ×1
GLOVE BIOGEL PI INDICATOR 8 (GLOVE) ×2
GLOVE SURG SS PI 6.5 STRL IVOR (GLOVE) ×3 IMPLANT
GLOVE SURG SS PI 7.5 STRL IVOR (GLOVE) ×2 IMPLANT
GOWN STRL NON-REIN LRG LVL3 (GOWN DISPOSABLE) ×4 IMPLANT
GOWN STRL REIN XL XLG (GOWN DISPOSABLE) ×5 IMPLANT
GRAFT BNE HEAD FEM 43 (Bone Implant) IMPLANT
GRAFT HEAD FEMORAL 43MM TISSUE (Bone Implant) ×2 IMPLANT
HEAD COCR CTAPER LFIT 36MM+10 (Orthopedic Implant) ×1 IMPLANT
IMMOBILIZER KNEE 20 (SOFTGOODS)
IMMOBILIZER KNEE 20 THIGH 36 (SOFTGOODS) IMPLANT
KIT BASIN OR (CUSTOM PROCEDURE TRAY) ×2 IMPLANT
MANIFOLD NEPTUNE II (INSTRUMENTS) ×2 IMPLANT
NDL SAFETY ECLIPSE 18X1.5 (NEEDLE) IMPLANT
NEEDLE HYPO 18GX1.5 SHARP (NEEDLE)
NS IRRIG 1000ML POUR BTL (IV SOLUTION) ×2 IMPLANT
PACK TOTAL JOINT (CUSTOM PROCEDURE TRAY) ×2 IMPLANT
PASSER SUT SWANSON 36MM LOOP (INSTRUMENTS) ×2 IMPLANT
PIN ESC CONSTR ACE LINER 36X56 ×1 IMPLANT
POSITIONER SURGICAL ARM (MISCELLANEOUS) ×2 IMPLANT
SCREW 6.5MMX25MM (Screw) ×1 IMPLANT
SCREW 6.5MMX30MM (Screw) ×1 IMPLANT
SCREW 6.5MMX40MM (Screw) ×1 IMPLANT
SCREW PERIPHERAL BONE SZ 5M (Screw) ×1 IMPLANT
SCREW TPR HD 5.0MM DIA 50 (Screw) ×2 IMPLANT
SPONGE GAUZE 4X4 12PLY (GAUZE/BANDAGES/DRESSINGS) ×2 IMPLANT
SPONGE LAP 18X18 X RAY DECT (DISPOSABLE) ×2 IMPLANT
STAPLER VISISTAT 35W (STAPLE) ×2 IMPLANT
SUCTION FRAZIER TIP 10 FR DISP (SUCTIONS) ×2 IMPLANT
SUT ETHIBOND NAB CT1 #1 30IN (SUTURE) ×4 IMPLANT
SUT VIC AB 1 CT1 27 (SUTURE) ×6
SUT VIC AB 1 CT1 27XBRD ANTBC (SUTURE) ×3 IMPLANT
SUT VIC AB 1 CT1 36 (SUTURE) ×1 IMPLANT
SUT VIC AB 2-0 CT1 27 (SUTURE) ×6
SUT VIC AB 2-0 CT1 TAPERPNT 27 (SUTURE) ×3 IMPLANT
SUT VLOC 180 0 24IN GS25 (SUTURE) ×4 IMPLANT
SWAB COLLECTION DEVICE MRSA (MISCELLANEOUS) ×1 IMPLANT
SYR 50ML LL SCALE MARK (SYRINGE) ×1 IMPLANT
TOWEL OR 17X26 10 PK STRL BLUE (TOWEL DISPOSABLE) ×3 IMPLANT
TOWEL OR NON WOVEN STRL DISP B (DISPOSABLE) ×1 IMPLANT
TRAY FOLEY CATH 14FRSI W/METER (CATHETERS) ×2 IMPLANT
TUBE ANAEROBIC SPECIMEN COL (MISCELLANEOUS) IMPLANT
WATER STERILE IRR 1500ML POUR (IV SOLUTION) ×2 IMPLANT

## 2013-03-18 NOTE — Anesthesia Postprocedure Evaluation (Signed)
  Anesthesia Post-op Note  Patient: Jake Adams  Procedure(s) Performed: Procedure(s) (LRB): LEFT TOTAL HIP REVISION (Left)  Patient Location: PACU  Anesthesia Type: General  Level of Consciousness: awake and alert   Airway and Oxygen Therapy: Patient Spontanous Breathing  Post-op Pain: mild  Post-op Assessment: Post-op Vital signs reviewed, Patient's Cardiovascular Status Stable, Respiratory Function Stable, Patent Airway and No signs of Nausea or vomiting  Last Vitals:  Filed Vitals:   03/18/13 1850  BP: 118/79  Pulse: 92  Temp: 36.1 C  Resp: 18    Post-op Vital Signs: stable   Complications: No apparent anesthesia complications

## 2013-03-18 NOTE — Preoperative (Addendum)
Beta Blockers   Reason not to administer Beta Blockers:Not Applicable 

## 2013-03-18 NOTE — Plan of Care (Signed)
Problem: Consults Goal: Diagnosis- Total Joint Replacement Revision left hip     

## 2013-03-18 NOTE — Transfer of Care (Signed)
Immediate Anesthesia Transfer of Care Note  Patient: Jake Adams  Procedure(s) Performed: Procedure(s): LEFT TOTAL HIP REVISION (Left)  Patient Location: PACU  Anesthesia Type:General  Level of Consciousness: awake, alert , oriented and patient cooperative  Airway & Oxygen Therapy: Patient Spontanous Breathing and Patient connected to face mask oxygen  Post-op Assessment: Report given to PACU RN, Post -op Vital signs reviewed and stable and Patient moving all extremities  Post vital signs: Reviewed and stable  Complications: No apparent anesthesia complications

## 2013-03-18 NOTE — Brief Op Note (Signed)
03/18/2013  3:44 PM  PATIENT:  Ned Card  72 y.o. male  PRE-OPERATIVE DIAGNOSIS:  Loose Left Total Hip Arthroplasty  POST-OPERATIVE DIAGNOSIS:  Loose Left Total Hip Arthroplasty  PROCEDURE:  LEFT ACETABULAR REVISION  SURGEON:  Surgeon(s) and Role:    * Loanne Drilling, MD - Primary  PHYSICIAN ASSISTANT:   ASSISTANTS: Avel Peace, PA-C   ANESTHESIA:   general  EBL:  Total I/O In: 2000 [I.V.:2000] Out: 775 [Urine:275; Blood:500]  BLOOD ADMINISTERED:none  DRAINS: (medium) Hemovact drain(s) in the left hip with  Suction Open   LOCAL MEDICATIONS USED:  OTHER Exparel  COUNTS:  YES  TOURNIQUET:  * No tourniquets in log *  DICTATION: .Other Dictation: Dictation Number [id number]  PLAN OF CARE: Admit to inpatient   PATIENT DISPOSITION:  PACU - hemodynamically stable.

## 2013-03-18 NOTE — Progress Notes (Signed)
ANTICOAGULATION CONSULT NOTE - Initial Consult  Pharmacy Consult for:  Coumadin Indication:  History of PE and VTE prophylaxis following left acetabular revision due to loose left THA  Allergies  Allergen Reactions  . Warfarin And Related     CAUSED SEVERE DIARRHEA-- PT IS ABLE TO TAKE AND DOES TAKE COUMADIN    Patient Measurements: Height: 5\' 9"  (175.3 cm) (taken 03/12/13) Weight: 212 lb (96.163 kg) (taken 03/12/13) IBW/kg (Calculated) : 70.7   Vital Signs: Temp: 97.8 F (36.6 C) (05/15 1749) Temp src: Oral (05/15 0948) BP: 118/74 mmHg (05/15 1749) Pulse Rate: 76 (05/15 1749)  Labs:  Recent Labs  03/18/13 1035  LABPROT 13.6  INR 1.05    Estimated Creatinine Clearance: 99.7 ml/min (by C-G formula based on Cr of 0.73).   Medical History: Past Medical History  Diagnosis Date  . Hypertension   . Dysrhythmia   . Pulmonary embolism 08/2010  . Osteoarthritis   . GERD (gastroesophageal reflux disease)   . PVC (premature ventricular contraction)   . ED (erectile dysfunction)   . Anxiety   . Hyperlipidemia   . Headache     HX OF MIGRAINES  . Pain     SEVERE BACK PAIN -STATES KYPHOPLASTY DID NOT HELP HIS PAIN  . Fat embolus 1995    POST OP    Medications:  Scheduled:  . acetaminophen  1,000 mg Intravenous Q6H  . atenolol  50 mg Oral BID  .  ceFAZolin (ANCEF) IV  2 g Intravenous Q6H  . [START ON 03/19/2013] dexamethasone  10 mg Oral Daily   Or  . [START ON 03/19/2013] dexamethasone  10 mg Intravenous Daily  . docusate sodium  100 mg Oral BID  . [START ON 03/19/2013] enoxaparin (LOVENOX) injection  30 mg Subcutaneous Q12H  . fentaNYL      . hydrocortisone  1 application Rectal BID  . LORazepam  1.5 mg Oral QHS  . pantoprazole  40 mg Oral Daily  . simvastatin  20 mg Oral q1800  . warfarin  6 mg Oral Once  . [START ON 03/19/2013] Warfarin - Pharmacist Dosing Inpatient   Does not apply q1800    Assessment:  Asked to assist with Coumadin therapy for this 72  year-old male following orthopedic surgery.  Mr. Boomer takes Coumadin at home due to a history of PE.  HIs usual dose is documented as 4 mg daily.  Lovenox 30 mg every 12 hours has been ordered, to continue until INR >/= 1.8.  Goal of Therapy:  INR 2-3 Monitor platelets by anticoagulation protocol: Yes   Plan:   Give Coumadin 6 mg tonight.  Follow PT/INR daily.  Polo Riley R.Ph. 03/18/2013 6:34 PM

## 2013-03-18 NOTE — Anesthesia Preprocedure Evaluation (Addendum)
Anesthesia Evaluation  Patient identified by MRN, date of birth, ID band Patient awake    Reviewed: Allergy & Precautions, H&P , NPO status , Patient's Chart, lab work & pertinent test results  Airway Mallampati: II TM Distance: >3 FB Neck ROM: Full    Dental no notable dental hx. (+) Edentulous Upper and Edentulous Lower   Pulmonary former smoker, PE (h/o post-op fat embolus 1995, PE 2011) breath sounds clear to auscultation  Pulmonary exam normal       Cardiovascular hypertension, Pt. on medications Rhythm:Regular Rate:Normal     Neuro/Psych negative neurological ROS  negative psych ROS   GI/Hepatic negative GI ROS, Neg liver ROS,   Endo/Other  negative endocrine ROS  Renal/GU negative Renal ROS  negative genitourinary   Musculoskeletal negative musculoskeletal ROS (+)   Abdominal   Peds negative pediatric ROS (+)  Hematology negative hematology ROS (+)   Anesthesia Other Findings   Reproductive/Obstetrics negative OB ROS                          Anesthesia Physical Anesthesia Plan  ASA: II  Anesthesia Plan: General   Post-op Pain Management:    Induction: Intravenous  Airway Management Planned: Oral ETT  Additional Equipment:   Intra-op Plan:   Post-operative Plan: Extubation in OR  Informed Consent: I have reviewed the patients History and Physical, chart, labs and discussed the procedure including the risks, benefits and alternatives for the proposed anesthesia with the patient or authorized representative who has indicated his/her understanding and acceptance.   Dental advisory given  Plan Discussed with: CRNA  Anesthesia Plan Comments:         Anesthesia Quick Evaluation

## 2013-03-18 NOTE — Interval H&P Note (Signed)
History and Physical Interval Note:  03/18/2013 1:31 PM  Jake Adams  has presented today for surgery, with the diagnosis of Loose Left Total Hip Arthroplasty  The various methods of treatment have been discussed with the patient and family. After consideration of risks, benefits and other options for treatment, the patient has consented to  Procedure(s): LEFT TOTAL HIP REVISION (Left) as a surgical intervention .  The patient's history has been reviewed, patient examined, no change in status, stable for surgery.  I have reviewed the patient's chart and labs.  Questions were answered to the patient's satisfaction.     Loanne Drilling

## 2013-03-19 ENCOUNTER — Encounter (HOSPITAL_COMMUNITY): Payer: Self-pay | Admitting: Orthopedic Surgery

## 2013-03-19 LAB — CBC
HCT: 32.9 % — ABNORMAL LOW (ref 39.0–52.0)
Platelets: 151 10*3/uL (ref 150–400)
RDW: 13.6 % (ref 11.5–15.5)
WBC: 7.3 10*3/uL (ref 4.0–10.5)

## 2013-03-19 LAB — BASIC METABOLIC PANEL
Chloride: 106 mEq/L (ref 96–112)
GFR calc Af Amer: >90 mL/min (ref >90)
Potassium: 4.2 mEq/L (ref 3.5–5.1)

## 2013-03-19 LAB — PROTIME-INR: Prothrombin Time: 14.7 seconds (ref 11.6–15.2)

## 2013-03-19 MED ORDER — TRAMADOL HCL 50 MG PO TABS: 50.0000 mg | ORAL_TABLET | Freq: Four times a day (QID) | ORAL | Status: DC | PRN

## 2013-03-19 MED ORDER — OXYCODONE HCL 5 MG PO TABS: 5.0000 mg | ORAL_TABLET | ORAL | Status: DC | PRN

## 2013-03-19 MED ORDER — METHOCARBAMOL 500 MG PO TABS: 500.0000 mg | ORAL_TABLET | Freq: Four times a day (QID) | ORAL | Status: DC | PRN

## 2013-03-19 MED ORDER — WARFARIN SODIUM 6 MG PO TABS: 6.0000 mg | ORAL_TABLET | Freq: Once | ORAL | Status: DC

## 2013-03-19 MED ORDER — WARFARIN SODIUM 6 MG PO TABS
6.0000 mg | ORAL_TABLET | Freq: Once | ORAL | Status: AC
  Administered 2013-03-19: 6 mg via ORAL
  Filled 2013-03-19: qty 1

## 2013-03-19 MED ORDER — ENOXAPARIN SODIUM 30 MG/0.3ML ~~LOC~~ SOLN: 30.0000 mg | Freq: Two times a day (BID) | SUBCUTANEOUS | Status: DC

## 2013-03-19 NOTE — Progress Notes (Signed)
Physical Therapy Treatment Patient Details Name: Jake Adams MRN: [id number] DOB: [birth year] Today's Date: 03/19/2013 Time: 4098-1191 PT Time Calculation (min): 15 min  PT Assessment / Plan / Recommendation Comments on Treatment Session  Pt reports that pain is less than prior to surgery. moves LLE well, reviewed THA precautions.    Follow Up Recommendations  Home health PT     Does the patient have the potential to tolerate intense rehabilitation     Barriers to Discharge        Equipment Recommendations  Rolling walker with 5" wheels    Recommendations for Other Services    Frequency 7X/week   Plan      Precautions / Restrictions Precautions Precautions: Posterior Hip   Pertinent Vitals/Pain 4 L hip. Ice applied.    Mobility  Bed Mobility Bed Mobility: Sit to Supine Supine to Sit: 4: Min assist Sit to Supine: 4: Min guard Details for Bed Mobility Assistance: pt able to place LLE onto bed. Transfers Sit to Stand: 4: Min guard Stand to Sit: 5: Supervision Details for Transfer Assistance: cues for precautions Ambulation/Gait Ambulation/Gait Assistance: 4: Min assist Ambulation Distance (Feet): 5 Feet Assistive device: Rolling walker Ambulation/Gait Assistance Details: cues to maintain holding onto RW, back up to bed. Gait Pattern: Step-to pattern;Antalgic    Exercises Total Joint Exercises Heel Slides: AAROM;Left;10 reps;Supine   PT Diagnosis: Difficulty walking;Acute pain  PT Problem List: Decreased strength;Decreased range of motion;Decreased activity tolerance;Decreased mobility;Decreased knowledge of precautions;Decreased safety awareness;Decreased knowledge of use of DME;Pain PT Treatment Interventions: DME instruction;Gait training;Functional mobility training;Stair training;Therapeutic exercise;Therapeutic activities;Patient/family education   PT Goals Acute Rehab PT Goals PT Goal Formulation: With patient/family Time For Goal Achievement:  03/26/13 Potential to Achieve Goals: Good Pt will go Supine/Side to Sit: with modified independence PT Goal: Supine/Side to Sit - Progress: Goal set today Pt will go Sit to Supine/Side: with modified independence PT Goal: Sit to Supine/Side - Progress: Progressing toward goal Pt will go Sit to Stand: with supervision PT Goal: Sit to Stand - Progress: Progressing toward goal Pt will go Stand to Sit: with supervision PT Goal: Stand to Sit - Progress: Progressing toward goal Pt will Ambulate: 51 - 150 feet;with supervision;with rolling walker PT Goal: Ambulate - Progress: Progressing toward goal Pt will Go Up / Down Stairs: 1-2 stairs;with supervision;with rolling walker PT Goal: Up/Down Stairs - Progress: Goal set today Additional Goals Additional Goal #1: state 3/3 post hip precautions. PT Goal: Additional Goal #1 - Progress: Progressing toward goal  Visit Information  Last PT Received On: 03/19/13 Assistance Needed: +1    Subjective Data  Subjective: I am in less pain than before surgery Patient Stated Goal: to go home,walk without pain.   Cognition  Cognition Arousal/Alertness: Awake/alert Behavior During Therapy: WFL for tasks assessed/performed Overall Cognitive Status: Within Functional Limits for tasks assessed    Balance     End of Session PT - End of Session Activity Tolerance: Patient tolerated treatment well Patient left: in bed;with family/visitor present;with call bell/phone within reach Nurse Communication: Mobility status   GP     Rada Hay 03/19/2013, 2:38 PM

## 2013-03-19 NOTE — Progress Notes (Signed)
Received orders for rw and commode.  Delivered to patient's room.   °

## 2013-03-19 NOTE — Evaluation (Signed)
Physical Therapy Evaluation Patient Details Name: Jake Adams MRN: [id number] DOB: [birth year] Today's Date: 03/19/2013 Time: 1030-1053 PT Time Calculation (min): 23 min  PT Assessment / Plan / Recommendation Clinical Impression  72 yo male admitted 03/18/13 for acetabular revision of L hip. Pt has had multiple surgeries on L hip. Pt ambulated x 50 '. will benefit from PT while in acute care.    PT Assessment  Patient needs continued PT services    Follow Up Recommendations  Home health PT    Does the patient have the potential to tolerate intense rehabilitation      Barriers to Discharge        Equipment Recommendations       Recommendations for Other Services     Frequency 7X/week    Precautions / Restrictions Precautions Precautions: Posterior Hip Restrictions Weight Bearing Restrictions: Yes LLE Weight Bearing: Partial weight bearing Other Position/Activity Restrictions: posterior thps   Pertinent Vitals/Pain 5 premedicated.      Mobility  Bed Mobility Bed Mobility: Supine to Sit Supine to Sit: 4: Min assist Transfers Sit to Stand: 4: Min assist Ambulation/Gait Ambulation/Gait Assistance: 4: Min Environmental consultant (Feet): 50 Feet Assistive device: Rolling walker Ambulation/Gait Assistance Details: cues for sequence, PWB Gait Pattern: Step-to pattern;Antalgic    Exercises     PT Diagnosis: Difficulty walking;Acute pain  PT Problem List: Decreased strength;Decreased range of motion;Decreased activity tolerance;Decreased mobility;Decreased knowledge of precautions;Decreased safety awareness;Decreased knowledge of use of DME;Pain PT Treatment Interventions: DME instruction;Gait training;Functional mobility training;Stair training;Therapeutic exercise;Therapeutic activities;Patient/family education   PT Goals Acute Rehab PT Goals PT Goal Formulation: With patient/family Time For Goal Achievement: 03/26/13 Potential to Achieve Goals: Good Pt will  go Supine/Side to Sit: with modified independence PT Goal: Supine/Side to Sit - Progress: Goal set today Pt will go Sit to Supine/Side: with modified independence PT Goal: Sit to Supine/Side - Progress: Goal set today Pt will go Sit to Stand: with supervision Pt will go Stand to Sit: with supervision PT Goal: Stand to Sit - Progress: Goal set today Pt will Ambulate: 51 - 150 feet;with supervision;with rolling walker PT Goal: Ambulate - Progress: Goal set today Pt will Go Up / Down Stairs: 1-2 stairs;with supervision;with rolling walker PT Goal: Up/Down Stairs - Progress: Goal set today Additional Goals Additional Goal #1: state 3/3 post hip precautions. PT Goal: Additional Goal #1 - Progress: Goal set today  Visit Information  Last PT Received On: 03/19/13 Assistance Needed: +1    Subjective Data  Subjective: This is the 50 th surgry Patient Stated Goal: to go home,walk without pain.   Prior Functioning  Home Living Lives With: Spouse Available Help at Discharge: Family Type of Home: House Home Access: Stairs to enter Secretary/administrator of Steps: 1 Home Layout: One level Bathroom Shower/Tub: Engineer, manufacturing systems: Standard Additional Comments: has had 8 hip surgeries Prior Function Level of Independence: Needs assistance (for adls) Comments: wife helped with LB dressing Communication Communication: No difficulties    Cognition  Cognition Arousal/Alertness: Awake/alert Behavior During Therapy: WFL for tasks assessed/performed Overall Cognitive Status: Within Functional Limits for tasks assessed    Extremity/Trunk Assessment Right Upper Extremity Assessment RUE ROM/Strength/Tone: WFL for tasks assessed Left Upper Extremity Assessment LUE ROM/Strength/Tone: WFL for tasks assessed Right Lower Extremity Assessment RLE ROM/Strength/Tone: WFL for tasks assessed RLE Sensation: WFL - Light Touch Left Lower Extremity Assessment LLE ROM/Strength/Tone:  Deficits LLE ROM/Strength/Tone Deficits: pt able to advance LE LLE Sensation: WFL - Light Touch  Balance    End of Session PT - End of Session Activity Tolerance: Patient tolerated treatment well Patient left: in chair;with call bell/phone within reach Nurse Communication: Mobility status  GP     Rada Hay 03/19/2013, 11:59 AM Blanchard Kelch PT [phone]

## 2013-03-19 NOTE — Progress Notes (Signed)
ANTICOAGULATION CONSULT NOTE - Follow Up Consult  Pharmacy Consult for Warfarin Indication: h/o PE and VTE prophylaxis following ortho surgery  Allergies  Allergen Reactions  . Warfarin And Related     CAUSED SEVERE DIARRHEA-- PT IS ABLE TO TAKE AND DOES TAKE COUMADIN    Patient Measurements: Height: 5\' 9"  (175.3 cm) (taken 03/12/13) Weight: 212 lb (96.163 kg) (taken 03/12/13) IBW/kg (Calculated) : 70.7  Vital Signs: Temp: 98.3 F (36.8 C) (05/16 0940) Temp src: Oral (05/16 0603) BP: 96/63 mmHg (05/16 0940) Pulse Rate: 82 (05/16 0940)  Labs:  Recent Labs  03/18/13 1035 03/19/13 0350  HGB  --  10.6*  HCT  --  32.9*  PLT  --  151  LABPROT 13.6 14.7  INR 1.05 1.17  CREATININE  --  0.69    Estimated Creatinine Clearance: 99.7 ml/min (by C-G formula based on Cr of 0.69).   Assessment: 46 yoM s/p left acetabular revision due to loose left THA 5/15.  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  PTA dose = 4 mg daily.  Warfarin resumed 5/15 PM.  Lovenox 30 mg q12h started 5/16 AM to bridge until INR >= 1.8.  INR 1.17  Hgb 10.6 < 13.6 following surgery No bleeding/complications reported  Goal of Therapy:  INR 2-3   Plan:  Warfarin 6 mg po once today. Daily INR.  Clance Boll 03/19/2013,9:59 AM

## 2013-03-19 NOTE — Discharge Summary (Signed)
Physician Discharge Summary   Patient ID: Jake Adams MRN: [id number] DOB/AGE: December 07, 1940 72 y.o.  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  Admission Diagnoses:  Past Medical History  Diagnosis Date  . Hypertension   . Dysrhythmia   . Pulmonary embolism 08/2010  . Osteoarthritis   . GERD (gastroesophageal reflux disease)   . PVC (premature ventricular contraction)   . ED (erectile dysfunction)   . Anxiety   . Hyperlipidemia   . Headache     HX OF MIGRAINES  . Pain     SEVERE BACK PAIN -STATES KYPHOPLASTY DID NOT HELP HIS PAIN  . Fat embolus 1995    POST OP   Discharge Diagnoses:   Principal Problem:   Failed total hip arthroplasty  Estimated body mass index is 31.29 kg/(m^2) as calculated from the following:   Height as of this encounter: 5\' 9"  (1.753 m).   Weight as of this encounter: 96.163 kg (212 lb).  Procedure(s) (LRB): LEFT TOTAL HIP REVISION (Left)   Consults: None  HPI: [name] is a 72 year old male with long complex  history in regard to his left hip. He has had multiple hip surgeries in  the past. I have recently taken over his care and noted that he has a  loose revision acetabular component. He has had significant pain and  has required narcotic analgesics to control this pain. At this stage,  the only thing that can potentially help him would be a revision of the  failed hip.  Laboratory Data: Admission on 03/18/2013  Component Date Value Range Status  . Prothrombin Time 03/18/2013 13.6  11.6 - 15.2 seconds Final  . INR 03/18/2013 1.05  0.00 - 1.49 Final  . WBC 03/19/2013 7.3  4.0 - 10.5 K/uL Final  . RBC 03/19/2013 3.94* 4.22 - 5.81 MIL/uL Final  . Hemoglobin 03/19/2013 10.6* 13.0 - 17.0 g/dL Final  . HCT 56/43/3295 32.9* 39.0 - 52.0 % Final  . MCV 03/19/2013 83.5  78.0 - 100.0 fL Final  . MCH 03/19/2013 26.9  26.0 - 34.0 pg Final  . MCHC 03/19/2013  32.2  30.0 - 36.0 g/dL Final  . RDW 18/84/1660 13.6  11.5 - 15.5 % Final  . Platelets 03/19/2013 151  150 - 400 K/uL Final  . Sodium 03/19/2013 138  135 - 145 mEq/L Final  . Potassium 03/19/2013 4.2  3.5 - 5.1 mEq/L Final  . Chloride 03/19/2013 106  96 - 112 mEq/L Final  . CO2 03/19/2013 28  19 - 32 mEq/L Final  . Glucose, Bld 03/19/2013 107* 70 - 99 mg/dL Final  . BUN 63/11/6008 6  6 - 23 mg/dL Final  . Creatinine, Ser 03/19/2013 0.69  0.50 - 1.35 mg/dL Final  . Calcium 93/23/5573 8.7  8.4 - 10.5 mg/dL Final  . GFR calc non Af Amer 03/19/2013 >90  >90 mL/min Final  . GFR calc Af Amer 03/19/2013 >90  >90 mL/min Final   Comment:                                 The eGFR has been calculated                          using the CKD EPI equation.  This calculation has not been                          validated in all clinical                          situations.                          eGFR's persistently                          <90 mL/min signify                          possible Chronic Kidney Disease.  Marland Kitchen Prothrombin Time 03/19/2013 14.7  11.6 - 15.2 seconds Final  . INR 03/19/2013 1.17  0.00 - 1.49 Final  Hospital Outpatient Visit on 03/12/2013  Component Date Value Range Status  . MRSA, PCR 03/12/2013 POSITIVE* NEGATIVE Final  . Staphylococcus aureus 03/12/2013 POSITIVE* NEGATIVE Final   Comment:                                 The Xpert SA Assay (FDA                          approved for NASAL specimens                          in patients over 59 years of age),                          is one component of                          a comprehensive surveillance                          program.  Test performance has                          been validated by [employer] for patients greater                          than or equal to 27 year old.                          It is not intended                          to diagnose  infection nor to                          guide or monitor treatment.  Marland Kitchen aPTT 03/12/2013 38* 24 - 37 seconds Final   Comment:  IF BASELINE aPTT IS ELEVATED,                          SUGGEST PATIENT RISK ASSESSMENT                          BE USED TO DETERMINE APPROPRIATE                          ANTICOAGULANT THERAPY.  . WBC 03/12/2013 5.0  4.0 - 10.5 K/uL Final  . RBC 03/12/2013 4.96  4.22 - 5.81 MIL/uL Final  . Hemoglobin 03/12/2013 13.6  13.0 - 17.0 g/dL Final  . HCT 40/98/1191 40.9  39.0 - 52.0 % Final  . MCV 03/12/2013 82.5  78.0 - 100.0 fL Final  . MCH 03/12/2013 27.4  26.0 - 34.0 pg Final  . MCHC 03/12/2013 33.3  30.0 - 36.0 g/dL Final  . RDW 47/82/9562 13.3  11.5 - 15.5 % Final  . Platelets 03/12/2013 214  150 - 400 K/uL Final  . Sodium 03/12/2013 137  135 - 145 mEq/L Final  . Potassium 03/12/2013 4.7  3.5 - 5.1 mEq/L Final  . Chloride 03/12/2013 103  96 - 112 mEq/L Final  . CO2 03/12/2013 27  19 - 32 mEq/L Final  . Glucose, Bld 03/12/2013 93  70 - 99 mg/dL Final  . BUN 13/06/6577 8  6 - 23 mg/dL Final  . Creatinine, Ser 03/12/2013 0.73  0.50 - 1.35 mg/dL Final  . Calcium 46/96/2952 9.7  8.4 - 10.5 mg/dL Final  . Total Protein 03/12/2013 7.1  6.0 - 8.3 g/dL Final  . Albumin 84/13/2440 3.7  3.5 - 5.2 g/dL Final  . AST 08/31/2535 18  0 - 37 U/L Final  . ALT 03/12/2013 11  0 - 53 U/L Final  . Alkaline Phosphatase 03/12/2013 103  39 - 117 U/L Final  . Total Bilirubin 03/12/2013 0.9  0.3 - 1.2 mg/dL Final  . GFR calc non Af Amer 03/12/2013 >90  >90 mL/min Final  . GFR calc Af Amer 03/12/2013 >90  >90 mL/min Final   Comment:                                 The eGFR has been calculated                          using the CKD EPI equation.                          This calculation has not been                          validated in all clinical                          situations.                          eGFR's persistently                           <90 mL/min signify  possible Chronic Kidney Disease.  Marland Kitchen Prothrombin Time 03/12/2013 22.2* 11.6 - 15.2 seconds Final  . INR 03/12/2013 2.04* 0.00 - 1.49 Final  . ABO/RH(D) 03/12/2013 AB POS   Final  . Antibody Screen 03/12/2013 NEG   Final  . Sample Expiration 03/12/2013 03/21/2013   Final  . Color, Urine 03/12/2013 YELLOW  YELLOW Final  . APPearance 03/12/2013 CLEAR  CLEAR Final  . Specific Gravity, Urine 03/12/2013 1.009  1.005 - 1.030 Final  . pH 03/12/2013 6.5  5.0 - 8.0 Final  . Glucose, UA 03/12/2013 NEGATIVE  NEGATIVE mg/dL Final  . Hgb urine dipstick 03/12/2013 NEGATIVE  NEGATIVE Final  . Bilirubin Urine 03/12/2013 NEGATIVE  NEGATIVE Final  . Ketones, ur 03/12/2013 NEGATIVE  NEGATIVE mg/dL Final  . Protein, ur 16/08/9603 NEGATIVE  NEGATIVE mg/dL Final  . Urobilinogen, UA 03/12/2013 0.2  0.0 - 1.0 mg/dL Final  . Nitrite 54/07/8118 NEGATIVE  NEGATIVE Final  . Leukocytes, UA 03/12/2013 NEGATIVE  NEGATIVE Final   MICROSCOPIC NOT DONE ON URINES WITH NEGATIVE PROTEIN, BLOOD, LEUKOCYTES, NITRITE, OR GLUCOSE <1000 mg/dL.  . ABO/RH(D) 03/12/2013 AB POS   Final     X-Rays:Dg Chest 2 View  03/12/2013   *RADIOLOGY REPORT*  Clinical Data: Preoperative radiograph for left total hip arthroplasty.  History of blood clots in the lungs.  CHEST - 2 VIEW  Comparison: 08/14/2010.  Findings: There is no acute cardiopulmonary disease. Cardiopericardial silhouette appears within normal limits.  Aortic arch atherosclerosis.  There are linear densities in the right midlung/right lower lobe better compatible with small fragments of embolized cement in this patient who has undergone prior vertebral augmentation.  At the right lung base, there is a pulmonary nodule measuring 11 mm.  In this patient with history of pleural effusion and pleural disease, this could represent rounded atelectasis or an area of scarring.  Follow-up noncontrast chest CT recommended.  IMPRESSION:  1.  No acute  cardiopulmonary disease. 2.  11 mm right basilar pulmonary nodule.  Follow-up noncontrast chest CT recommended for further assessment. 3. These results will be called to the ordering clinician or representative by the Radiologist Assistant, and communication documented in the PACS Dashboard. 4.  Tiny linear branching densities in the lungs compatible with embolized bone cement from vertebral augmentation.   Original Report Authenticated By: Andreas Newport, M.D.   Dg Hip Complete Left  03/12/2013   *RADIOLOGY REPORT*  Clinical Data: Preop for left total hip arthroplasty revision.  LEFT HIP - COMPLETE 2+ VIEW  Comparison: 06/07/2005.  Findings: Prior left total hip replacement.  Fracture of the acetabular screws with loosening.  Fracture of greater trochanteric cerclage wires.  Wire migrated to the acetabular level.  Prior cement augmentation L4 and L5 vertebral.  Prominent right hip joint degenerative changes.  In addition to bone donor site from the left ilium, bony overgrowth of the ilium noted greater on the right (possibly primary bone lesion such as an exostosis).  IMPRESSION: Failure of left total hip prosthesis as detailed above.   Original Report Authenticated By: Lacy Duverney, M.D.   Ct Chest Wo Contrast  03/15/2013   *RADIOLOGY REPORT*  Clinical Data: Abnormal lung nodule  CT CHEST WITHOUT CONTRAST  Technique:  Multidetector CT imaging of the chest was performed following the standard protocol without IV contrast.  Comparison: Chest CT 08/11/2010  Findings: No pleural effusion is identified.  No airspace consolidation identified.  There are scattered areas of tree in bud nodularity within the right middle lobe and basilar portion of  the right upper lobe, image 30/series 4 and image 44/series 4. No suspicious pulmonary parenchymal nodule or mass identified.  Heart size appears normal.  No pericardial effusion. Calcifications involving the LAD coronary artery is identified. No mediastinal or hilar  adenopathy noted.  Imaging through the upper abdomen demonstrate a 7 mm stone within the upper pole the left kidney.  No acute findings identified.  Review of the visualized osseous structures is significant for mild degenerative disc disease.  IMPRESSION:  1.  No acute findings. 2.  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 3.  Coronary artery calcifications.   Original Report Authenticated By: Signa Kell, M.D.   Dg Pelvis Portable  03/18/2013   *RADIOLOGY REPORT*  Clinical Data: Status post revision of left hip replacement.  PORTABLE PELVIS  Comparison: Plain films left hip 03/12/2013.  Findings: Acetabular cup of a left hip arthroplasty has been replaced.  New locking ring is identified.  The new acetabular cup appears well positioned with multiple anchoring screws in place. Surgical drain and staples are noted.  The femoral component is intact with cerclage wires about the proximal femur identified.  Advanced right hip osteoarthritis is noted.  Defects in the iliac wings are compatible with prior bone graft harvest on the left and likely trauma on the right.  The patient is status post lower lumbar vertebroplasty. Advanced right hip osteoarthritis is noted.  IMPRESSION: Revision of the acetabular component of a left hip replacement without evidence of complication.  No acute abnormality.   Original Report Authenticated By: Holley Dexter, M.D.    EKG: Orders placed in visit on 03/12/13  . EKG 12-LEAD     Hospital Course: Patient was admitted to [facility] and taken to the OR and underwent the above state procedure without complications.  Patient tolerated the procedure well and was later transferred to the recovery room and then to the orthopaedic floor for postoperative care.  They were given PO and IV analgesics for pain control following their surgery.  They were given 24 hours of  postoperative antibiotics of  Anti-infectives   Start     Dose/Rate Route Frequency Ordered Stop   03/18/13 2000  ceFAZolin (ANCEF) IVPB 2 g/50 mL premix     2 g 100 mL/hr over 30 Minutes Intravenous Every 6 hours 03/18/13 1659 03/19/13 0227   03/18/13 1330  vancomycin (VANCOCIN) IVPB 1000 mg/200 mL premix     1,000 mg 200 mL/hr over 60 Minutes Intravenous  Once 03/18/13 1329 03/18/13 1430   03/18/13 1000  ceFAZolin (ANCEF) IVPB 2 g/50 mL premix     2 g 100 mL/hr over 30 Minutes Intravenous On call to O.R. 03/18/13 0454 03/18/13 1345     and started on DVT prophylaxis in the form of Lovenox and Coumadin.   PT and OT were ordered for total hip protocol.  The patient was allowed to be WBAT with therapy. Discharge planning was consulted to help with postop disposition and equipment needs.  Patient had a decent night on the evening of surgery.  They started to get up OOB with therapy on day one walking about 50 feet.  Hemovac drain was pulled without difficulty.  The knee immobilizer was removed and discontinued.  Continued to work with therapy into day two.  Dressing was changed on day two and the incision was healing well.  Patient was seen in rounds and was ready to go  home later on POD 2.   Discharge Medications: Prior to Admission medications   Medication Sig Start Date End Date Taking? Authorizing Provider  atenolol (TENORMIN) 50 MG tablet Take 50 mg by mouth 2 (two) times daily.   Yes Historical Provider, MD  hydrocortisone (ANUSOL-HC) 2.5 % rectal cream Place 1 application rectally 2 (two) times daily.   Yes Historical Provider, MD  LORazepam (ATIVAN) 1 MG tablet Take 1.5 mg by mouth at bedtime. For anxiety   Yes Historical Provider, MD  pantoprazole (PROTONIX) 40 MG tablet Take 40 mg by mouth daily.   Yes Historical Provider, MD  pravastatin (PRAVACHOL) 40 MG tablet Take 40 mg by mouth every evening.    Yes Historical Provider, MD  clotrimazole-betamethasone (LOTRISONE) cream Apply 1  application topically 2 (two) times daily.  11/18/12   Historical Provider, MD  enoxaparin (LOVENOX) 30 MG/0.3ML injection Inject 0.3 mLs (30 mg total) into the skin every 12 (twelve) hours. Continue Lovenox injections until the INR is therapeutic at or greater than 2.0.  When INR reaches the therapeutic level of equal to or greater than 2.0, the patient may discontinue the Lovenox injections. 03/19/13   Nicie Milan Julien Girt, PA-C  methocarbamol (ROBAXIN) 500 MG tablet Take 1 tablet (500 mg total) by mouth every 6 (six) hours as needed. 03/19/13   Tomy Khim, PA-C  oxyCODONE (OXY IR/ROXICODONE) 5 MG immediate release tablet Take 1-2 tablets (5-10 mg total) by mouth every 3 (three) hours as needed. 03/19/13   Jocilynn Grade, PA-C  traMADol (ULTRAM) 50 MG tablet Take 1-2 tablets (50-100 mg total) by mouth every 6 (six) hours as needed (mild pain). 03/19/13   Keone Kamer Julien Girt, PA-C  warfarin (COUMADIN) 6 MG tablet Take 1 tablet (6 mg total) by mouth one time only at 6 PM. Take Coumadin for three weeks for postoperative protocol and then the patient may resume their previous Coumadin home regimen.  The dose may need to be adjusted based upon the INR.  Please follow the INR and titrate Coumadin dose for a therapeutic range between 2.0 and 3.0 INR.  After completing the three weeks of Coumadin, the patient may resume their previous Coumadin home regimen. 03/19/13   Cord Wilczynski Julien Girt, PA-C    Diet: Cardiac diet Activity: PWB 25-50% No bending hip over 90 degrees- A "L" Angle Do not cross legs Do not let foot roll inward When turning these patients a pillow should be placed between the patient's legs to prevent crossing. Patients should have the affected knee fully extended when trying to sit or stand from all surfaces to prevent excessive hip flexion. When ambulating and turning toward the affected side the affected leg should have the toes turned out prior to moving the walker and the rest of  patient's body as to prevent internal rotation/ turning in of the leg. Abduction pillows are the most effective way to prevent a patient from not crossing legs or turning toes in at rest. If an abduction pillow is not ordered placing a regular pillow length wise between the patient's legs is also an effective reminder. It is imperative that these precautions be maintained so that the surgical hip does not dislocate. Follow-up:in 2 weeks Disposition - Home Discharged Condition: good       Discharge Orders   Future Orders Complete By Expires     Call MD / Call 911  As directed     Comments:      If you experience chest pain or shortness of breath, CALL 911  and be transported to the hospital emergency room.  If you develope a fever above 101 F, pus (white drainage) or increased drainage or redness at the wound, or calf pain, call your surgeon's office.    Change dressing  As directed     Comments:      You may change your dressing dressing daily with sterile 4 x 4 inch gauze dressing and paper tape.  Do not submerge the incision under water.    Constipation Prevention  As directed     Comments:      Drink plenty of fluids.  Prune juice may be helpful.  You may use a stool softener, such as Colace (over the counter) 100 mg twice a day.  Use MiraLax (over the counter) for constipation as needed.    Diet - low sodium heart healthy  As directed     Discharge instructions  As directed     Comments:      Pick up stool softner and laxative for home. Do not submerge incision under water. May shower. Continue to use ice for pain and swelling from surgery. Hip precautions.  Total Hip Protocol.  Take Coumadin for three weeks for postoperative protocol and then the patient may resume their previous Coumadin home regimen.  The dose may need to be adjusted based upon the INR.  Please follow the INR and titrate Coumadin dose for a therapeutic range between 2.0 and 3.0 INR.  After completing the three  weeks of Coumadin, the patient may resume their previous Coumadin home regimen.  Continue Lovenox injections until the INR is therapeutic at or greater than 2.0.  When INR reaches the therapeutic level of equal to or greater than 2.0, the patient may discontinue the Lovenox injections.    Do not sit on low chairs, stoools or toilet seats, as it may be difficult to get up from low surfaces  As directed     Driving restrictions  As directed     Comments:      No driving until released by the physician.    Follow the hip precautions as taught in Physical Therapy  As directed     Increase activity slowly as tolerated  As directed     Lifting restrictions  As directed     Comments:      No lifting until released by the physician.    Partial weight bearing  As directed     Scheduling Instructions:      25-50% Partial Weight Bearing    Patient may shower  As directed     Comments:      You may shower without a dressing once there is no drainage.  Do not wash over the wound.  If drainage remains, do not shower until drainage stops.    TED hose  As directed     Comments:      Use stockings (TED hose) for 3 weeks on both leg(s).  You may remove them at night for sleeping.        Medication List    STOP taking these medications       HYDROcodone-acetaminophen 7.5-325 MG per tablet  Commonly known as:  NORCO      TAKE these medications       atenolol 50 MG tablet  Commonly known as:  TENORMIN  Take 50 mg by mouth 2 (two) times daily.     clotrimazole-betamethasone cream  Commonly known as:  LOTRISONE  Apply 1 application topically 2 (  two) times daily.     enoxaparin 30 MG/0.3ML injection  Commonly known as:  LOVENOX  Inject 0.3 mLs (30 mg total) into the skin every 12 (twelve) hours. Continue Lovenox injections until the INR is therapeutic at or greater than 2.0.  When INR reaches the therapeutic level of equal to or greater than 2.0, the patient may discontinue the Lovenox  injections.     hydrocortisone 2.5 % rectal cream  Commonly known as:  ANUSOL-HC  Place 1 application rectally 2 (two) times daily.     LORazepam 1 MG tablet  Commonly known as:  ATIVAN  Take 1.5 mg by mouth at bedtime. For anxiety     methocarbamol 500 MG tablet  Commonly known as:  ROBAXIN  Take 1 tablet (500 mg total) by mouth every 6 (six) hours as needed.     oxyCODONE 5 MG immediate release tablet  Commonly known as:  Oxy IR/ROXICODONE  Take 1-2 tablets (5-10 mg total) by mouth every 3 (three) hours as needed.     pantoprazole 40 MG tablet  Commonly known as:  PROTONIX  Take 40 mg by mouth daily.     pravastatin 40 MG tablet  Commonly known as:  PRAVACHOL  Take 40 mg by mouth every evening.     traMADol 50 MG tablet  Commonly known as:  ULTRAM  Take 1-2 tablets (50-100 mg total) by mouth every 6 (six) hours as needed (mild pain).     warfarin 6 MG tablet  Commonly known as:  COUMADIN  Take 1 tablet (6 mg total) by mouth one time only at 6 PM. Take Coumadin for three weeks for postoperative protocol and then the patient may resume their previous Coumadin home regimen.  The dose may need to be adjusted based upon the INR.  Please follow the INR and titrate Coumadin dose for a therapeutic range between 2.0 and 3.0 INR.  After completing the three weeks of Coumadin, the patient may resume their previous Coumadin home regimen.       Follow-up Information   Follow up with Loanne Drilling, MD. Schedule an appointment as soon as possible for a visit in 2 weeks.   Contact information:   [address] [place] [address] [phone]       Signed: Patrica Duel 03/19/2013, 12:48 PM

## 2013-03-19 NOTE — Evaluation (Signed)
Occupational Therapy Evaluation Patient Details Name: Jake Adams MRN: [id number] DOB: 06-Feb-1941 Today's Date: 03/19/2013 Time: 1324-4010 OT Time Calculation (min): 28 min  OT Assessment / Plan / Recommendation Clinical Impression  This 72 year old man was admitted for L THR.  He is PWB and has posterior THPs.  Will follow in acute to further educate on toileting with precautions.      OT Assessment  Patient needs continued OT Services    Follow Up Recommendations  No OT follow up    Barriers to Discharge      Equipment Recommendations  3 in 1 bedside comode    Recommendations for Other Services    Frequency  Min 2X/week    Precautions / Restrictions Precautions Precautions: Posterior Hip Restrictions Weight Bearing Restrictions: Yes RLE Weight Bearing: Partial weight bearing Other Position/Activity Restrictions: posterior thps   Pertinent Vitals/Pain 6/10 L hip when ambulating    ADL  Toilet Transfer: Simulated;Minimal assistance Toilet Transfer Method: Sit to stand Toilet Transfer Equipment:  (ambulated and sat in chair) Transfers/Ambulation Related to ADLs: ambulated with cues for sequence.  Pt able to follow PWB. ADL Comments: Pt can perform UB adls.  Wife will help with LB.  Pt still has reacher at home.  They do not have any other DME/AE.  discussed tub transfer; he cannot do this initially due to [facility].  Pt likes to sit at the bottom of tub and explained he can't do this with THPs.  He may want to consider a tub seat vs. bench (and removed doors).  Also reviewed standing for hygiene--pt states he likes to lean backwards sitting to reach for hygiene:  educated that he needs to make sure that he doesn't internally rotate.  Will try to further assess this tomorrow.  Wife was out of room initially--reviewed this with her also.      OT Diagnosis: Generalized weakness  OT Problem List: Pain;Decreased knowledge of precautions;Decreased knowledge of use of DME or AE;Decreased  strength OT Treatment Interventions: Self-care/ADL training;Patient/family education;DME and/or AE instruction   OT Goals Acute Rehab OT Goals OT Goal Formulation: With patient/family Time For Goal Achievement: 03/26/13 Potential to Achieve Goals: Good Miscellaneous OT Goals Miscellaneous OT Goal #1: Pt will ambulate to bathroom with 3:1 over toilet and complete transfers and hygiene with supervision OT Goal: Miscellaneous Goal #1 - Progress: Goal set today Miscellaneous OT Goal #2: Pt will verbalize tub seat (when WBAT) vs. bench for tub OT Goal: Miscellaneous Goal #2 - Progress: Goal set today  Visit Information  Last OT Received On: 03/19/13 Assistance Needed: +1 PT/OT Co-Evaluation/Treatment: Yes    Subjective Data  Subjective: I've had 8 hip surgeries.   Patient Stated Goal: Have this heal right and go home tomorrow   Prior Functioning     Home Living Lives With: Spouse Available Help at Discharge: Family Type of Home: House Home Access: Stairs to enter Secretary/administrator of Steps: 1 Home Layout: One level Bathroom Shower/Tub: Engineer, manufacturing systems: Standard Additional Comments: has had 8 hip surgeries Prior Function Level of Independence: Needs assistance (for adls) Comments: wife helped with LB dressing Communication Communication: No difficulties         Vision/Perception     Cognition  Cognition Arousal/Alertness: Awake/alert Behavior During Therapy: WFL for tasks assessed/performed Overall Cognitive Status: Within Functional Limits for tasks assessed    Extremity/Trunk Assessment Right Upper Extremity Assessment RUE ROM/Strength/Tone: [facility] for tasks assessed Left Upper Extremity Assessment LUE ROM/Strength/Tone: [facility] for tasks assessed  Mobility Bed Mobility Bed Mobility: Supine to Sit Supine to Sit: 4: Min assist Transfers Transfers: Sit to Stand Sit to Stand: 4: Min assist     Exercise     Balance     End of Session  OT - End of Session Activity Tolerance: Patient tolerated treatment well Patient left: in chair;with call bell/phone within reach  GO     [facility] 03/19/2013, 11:36 AM

## 2013-03-19 NOTE — Progress Notes (Signed)
Utilization review completed.  

## 2013-03-19 NOTE — Care Management Note (Unsigned)
    Page 1 of 2   03/19/2013     2:35:31 PM   CARE MANAGEMENT NOTE 03/19/2013  Patient:  Jake Adams, Jake Adams   Account Number:  [id number]  Date Initiated:  03/19/2013  Documentation initiated by:  Colleen Can  Subjective/Objective Assessment:   DX LEFT ACETABULAR REVISION     Action/Plan:   CM  spoke with patient. Plans are for patient to return to his home in [place] [place] [facility] where spouse will be caregiver. He will need RW and 3n1 and hhpt and RN. Has used [facility] in the past for hh services.   Anticipated DC Date:  03/19/2013   Anticipated DC Plan:  HOME W HOME HEALTH SERVICES      DC Planning Services  CM consult      PAC Choice  DURABLE MEDICAL EQUIPMENT  HOME HEALTH   Choice offered to / List presented to:  C-1 Patient   DME arranged  3-N-1  Levan Hurst      DME agency  [facility]     [facility] arranged  HH-1 RN  HH-2 PT      [facility] agency  [facility]   Status of service:  In process, will continue to follow Medicare Important Message given?   (If response is "NO", the following Medicare IM given date fields will be blank) Date Medicare IM given:   Date Additional Medicare IM given:    Discharge Disposition:    Per UR Regulation:    If discussed at Long Length of Stay Meetings, dates discussed:    Comments:  03/19/2013 Colleen Can BSN RN CCM  [phone] [facility] will be able to provide HHRN/PT & DME. Anticipate d/c 03/20/2013. [facility] DME rep notified of dme need. [facility] aware of need for coumadin managemnt.

## 2013-03-19 NOTE — Progress Notes (Signed)
   Subjective: 1 Day Post-Op Procedure(s) (LRB): LEFT TOTAL HIP REVISION (Left) Patient reports pain as mild.   Patient seen in rounds with Dr. Lequita Halt. Patient is well, and has had no acute complaints or problems We will start therapy today.  Plan is to go Home after hospital stay.  Objective: Vital signs in last 24 hours: Temp:  [96.8 F (36 C)-98.3 F (36.8 C)] 98.3 F (36.8 C) (05/16 0940) Pulse Rate:  [60-92] 82 (05/16 0940) Resp:  [11-18] 16 (05/16 0940) BP: (96-121)/(62-79) 96/63 mmHg (05/16 0940) SpO2:  [96 %-100 %] 96 % (05/16 0940) Weight:  [96.163 kg (212 lb)] 96.163 kg (212 lb) (05/15 1820)  Intake/Output from previous day:  Intake/Output Summary (Last 24 hours) at 03/19/13 1041 Last data filed at 03/19/13 0900  Gross per 24 hour  Intake 5083.75 ml  Output   4305 ml  Net 778.75 ml    Intake/Output this shift: Total I/O In: 240 [P.O.:240] Out: 500 [Urine:225; Drains:275]  Labs:  Recent Labs  03/19/13 0350  HGB 10.6*    Recent Labs  03/19/13 0350  WBC 7.3  RBC 3.94*  HCT 32.9*  PLT 151    Recent Labs  03/19/13 0350  NA 138  K 4.2  CL 106  CO2 28  BUN 6  CREATININE 0.69  GLUCOSE 107*  CALCIUM 8.7    Recent Labs  03/18/13 1035 03/19/13 0350  INR 1.05 1.17    EXAM General - Patient is Alert, Appropriate and Oriented Extremity - Neurovascular intact Sensation intact distally Dorsiflexion/Plantar flexion intact Dressing - dressing C/D/I Motor Function - intact, moving foot and toes well on exam.  Hemovac pulled without difficulty.  Past Medical History  Diagnosis Date  . Hypertension   . Dysrhythmia   . Pulmonary embolism 08/2010  . Osteoarthritis   . GERD (gastroesophageal reflux disease)   . PVC (premature ventricular contraction)   . ED (erectile dysfunction)   . Anxiety   . Hyperlipidemia   . Headache     HX OF MIGRAINES  . Pain     SEVERE BACK PAIN -STATES KYPHOPLASTY DID NOT HELP HIS PAIN  . Fat embolus 1995      POST OP    Assessment/Plan: 1 Day Post-Op Procedure(s) (LRB): LEFT TOTAL HIP REVISION (Left) Principal Problem:   Failed total hip arthroplasty  Estimated body mass index is 31.29 kg/(m^2) as calculated from the following:   Height as of this encounter: 5\' 9"  (1.753 m).   Weight as of this encounter: 96.163 kg (212 lb). Up with therapy Plan for discharge tomorrow Discharge home with home health  DVT Prophylaxis - Lovenox and Coumadin  Take Coumadin for three weeks for postoperative protocol and then the patient may resume their previous Coumadin home regimen.  The dose may need to be adjusted based upon the INR.  Please follow the INR and titrate Coumadin dose for a therapeutic range between 2.0 and 3.0 INR.  After completing the three weeks of Coumadin, the patient may resume their previous Coumadin home regimen.  Continue Lovenox injections until the INR is therapeutic at or greater than 2.0.  When INR reaches the therapeutic level of equal to or greater than 2.0, the patient may discontinue the Lovenox injections.  Weight Bearing As Tolerated left Leg D/C Knee Immobilizer Hemovac Pulled Begin Therapy Hip Preacutions No vaccines.  Ethanjames Fontenot 03/19/2013, 10:41 AM

## 2013-03-19 NOTE — Op Note (Signed)
Jake Adams, Jake Adams NO.:  [id number]  MEDICAL RECORD NO.:  [id number]  LOCATION:  1601                         FACILITY:  [facility]  PHYSICIAN:  Ollen Gross, M.D.    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  PROCEDURE:  Left acetabular revision.  SURGEON:  Ollen Gross, MD  ASSISTANT:  Avel Peace, PA-C  ANESTHESIA:  General.  ESTIMATED BLOOD LOSS:  350.  DRAIN:  Hemovac x1.  COMPLICATIONS:  None.  CONDITION:  Stable to recovery.  BRIEF CLINICAL NOTE:  Jake Adams is a 72 year old male with long complex history in regard to his left hip.  He has had multiple hip surgeries in the past.  I have recently taken over his care and noted that he has a loose revision acetabular component.  He has had significant pain and has required narcotic analgesics to control this pain.  At this stage, the only thing that can potentially help him would be a revision of the failed hip.  PROCEDURE IN DETAIL:  After successful administration of general anesthetic, the patient was placed in the right lateral decubitus position with the left side up and held with the hip positioner.  His left lower extremity was isolated from his perineum with plastic drapes and prepped and draped in the usual sterile fashion.  One of his previous posterolateral incision was reutilized.  Skin cut with a 10 blade through the subcutaneous tissue to the level of the fascia lata, which was incised in line with the skin incision.  He has a metal stained tissue which was excised.  The sciatic nerve was then palpated and protected.  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  They would retract anteriorly.  I was then able to expose the hip joint.  His cup was essentially vertical and retroverted. I was able to dislocate the hip and remove the femoral head.  Femoral component was well fixed and was in good position.  This was left intact.  The femur was then retracted anteriorly to gain exposure of the acetabulum.  Circumferential retraction was achieved and the liner was then removed from the acetabular shell.  The shell was essentially wiggling in the acetabular bony bed just being held in place by screws. Screws were removed and the cup then easily removed with no bony ingrowth on its metal shell.  The defect was mostly central but he does have a medial wall still present.  There was anterior and posterior column still intact.  He had a lot of bone loss superiorly.  The size of the cup removed was a 74 mm.  I felt that with adequate structural bone graft superiorly, we would be able to get this down to a much smaller acetabular shell and be able to preserve more bone for the future.  We thus used a  femoral head allograft to fill the superior defect.  The frozen femoral head allograft was thawed in warm saline.  It was a 48 mm femoral head.  I reamed the defect to 47 mm to get bleeding bony bed and essentially press fit the graft into the defect.  This really looked excellent fit of the graft into defect and I temporarily transfixed it with 2 Steinmann pins to hold it in place.  We then began reaming the patient's native acetabulum starting at 47 mm, coursing increments up to 61 mm.  We had excellent circumferential fill with the 61 mm reamer.  Placed a 62 mm pinnacle revision acetabular shell in anatomic position.  I transfixed this with 3 dome screws and 3 peripheral screws each with excellent purchase.  I then threaded the impactor back into the acetabular shell and was essentially be able to lift the patient's  pelvis off the table in this manner. Thus the shell was found to be very stable.  We then placed a trial 36 mm neutral +4 liner.  The patient's femoral component was again inspected and was well fixed and well positioned.  With the maximum of 36+ 10 C taper head, the hip was reduced and there was some laxity with flexion.  It was felt that given the patient has a incompetent abductor mechanism due to the atrophic nonunion of the greater trochanter as well as the fact that stem will not allow for appropriate soft tissue tension also coupled with the fact that the cup was extremely stable, led me to the decision of placing a constrained liner in the acetabular shell.  The pinnacle constrained liner was then impacted into the acetabular shell.  A 36+ 5 C taper head was placed onto the femoral stem.  The stem was reduced and the locking ring placed.  This was locked into place through range of motion.  There was no evidence of impingement or laxity.  By placing the left leg on top of the right, he was very close to equal leg lengths with the left just tight but shorter about couple of mm.  Wound was then copiously irrigated with saline solution and the posterior tissues were reattached to the femur with Ethibond suture.  Note that there was absolutely no way to reduce this greater trochanter fragment and even if I was able to reduce, there was no way it was going to heal as the bone was so atrophic now.  I just placed through range of motion to make sure we would not impinge and there was no evidence of impingement.  We thus plus left that fragment alone.  The fascia lata was then closed over Hemovac drain with a running #1 V-Loc suture.  A total of 20 mL of Exparel mixed with 50 mL of saline were injected into the fascia lata, gluteal muscles, and subcu tissues.  Subcu was then closed with interrupted 2-0 Vicryl and skin closed with staples.  The drains hooked to suction.  Incision cleaned  and dried and a bulky sterile dressing applied.  He was then awakened and transported to recovery in stable condition.     Ollen Gross, M.D.     FA/MEDQ  D:  03/18/2013  T:  03/19/2013  Job:  [id number]

## 2013-03-20 LAB — CBC
HCT: 31.4 % — ABNORMAL LOW (ref 39.0–52.0)
MCHC: 32.2 g/dL (ref 30.0–36.0)
MCV: 83.3 fL (ref 78.0–100.0)
RDW: 13.6 % (ref 11.5–15.5)

## 2013-03-20 LAB — BASIC METABOLIC PANEL
BUN: 11 mg/dL (ref 6–23)
Calcium: 9.2 mg/dL (ref 8.4–10.5)
Creatinine, Ser: 0.62 mg/dL (ref 0.50–1.35)
GFR calc Af Amer: >90 mL/min (ref >90)
GFR calc non Af Amer: >90 mL/min (ref >90)
Potassium: 3.4 mEq/L — ABNORMAL LOW (ref 3.5–5.1)

## 2013-03-20 MED ORDER — WARFARIN SODIUM 6 MG PO TABS
6.0000 mg | ORAL_TABLET | Freq: Once | ORAL | Status: DC
  Filled 2013-03-20: qty 1

## 2013-03-20 MED ORDER — WARFARIN SODIUM 2 MG PO TABS: 4.0000 mg | ORAL_TABLET | Freq: Every day | ORAL | Status: AC

## 2013-03-20 NOTE — Progress Notes (Signed)
Occupational Therapy Treatment Patient Details Name: Jake Adams MRN: [id number] DOB: Sep 24, 1941 Today's Date: 03/20/2013 Time: 1001-1030 OT Time Calculation (min): 29 min  OT Assessment / Plan / Recommendation Comments on Treatment Session Pt further educated on THPs as he is having difficulty adhering to all. He is unable to state precautions on his own. Reinfoced/reviewed all THPs and application to ADL. DME in room. Recommend HHOT to reinforce THPs further in home environment.     Follow Up Recommendations  Home health OT;Supervision/Assistance - 24 hour    Barriers to Discharge       Equipment Recommendations  3 in 1 bedside comode    Recommendations for Other Services    Frequency Min 2X/week   Plan Discharge plan needs to be updated    Precautions / Restrictions Precautions Precautions: Posterior Hip Precaution Comments: Needs reinforcement of THPs Restrictions Weight Bearing Restrictions: Yes LLE Weight Bearing: Partial weight bearing LLE Partial Weight Bearing Percentage or Pounds: 50        ADL  Toilet Transfer: Performed;Min guard Toilet Transfer Method: Other (comment) (with walker and min/mod verbal cues for THPs) Toilet Transfer Equipment: Raised toilet seat with arms (or 3-in-1 over toilet) Toileting - Clothing Manipulation and Hygiene: Simulated;Min guard Where Assessed - Toileting Clothing Manipulation and Hygiene: Sit to stand from 3-in-1 or toilet Equipment Used: Rolling walker;Long-handled shoe horn ADL Comments: Pt need frequent cues for THPs and for hand placement for safety. Wife present and educated both pt and wife on THPs and application to ADL. Pt wanting to dress for home. He does not plan to use AE so emphasized his wife MUST start clothing over feet for him and pull up to knees before he reaches to pull up the rest of the way.  Assisted pt to dress so OT could further educate on how wife is to help and THP awareness. Noted pt sitting upright in  bed and reaching below knee. Explained pt should not do this with his THPs. He is planning to sponge bathe initially and doenst want to purchase tub bench right now. Pt able to sit on toilet and lean back and reach for posterior hygiene without breaking THPs.     OT Diagnosis:    OT Problem List:   OT Treatment Interventions:     OT Goals Miscellaneous OT Goals OT Goal: Miscellaneous Goal #1 - Progress: Progressing toward goals OT Goal: Miscellaneous Goal #2 - Progress: Met  Visit Information  Last OT Received On: 03/20/13 Assistance Needed: +1    Subjective Data  Subjective: I am going to sponge bathe Patient Stated Goal: ready to go home    Prior Functioning       Cognition  Cognition Arousal/Alertness: Awake/alert Behavior During Therapy: WFL for tasks assessed/performed Overall Cognitive Status: Within Functional Limits for tasks assessed    Mobility  Bed Mobility Bed Mobility: Supine to Sit Supine to Sit: 5: Supervision;HOB elevated Sit to Supine: 6: Modified independent (Device/Increase time) Transfers Transfers: Sit to Stand;Stand to Sit Sit to Stand: 4: Min guard;With upper extremity assist;From bed;From chair/3-in-1 Stand to Sit: 4: Min guard;With upper extremity assist;To chair/3-in-1 Details for Transfer Assistance: verbal cues for hand placement and THPs    Exercises      Balance Balance Balance Assessed: Yes Dynamic Standing Balance Dynamic Standing - Level of Assistance: 5: Stand by assistance   End of Session OT - End of Session Activity Tolerance: Patient tolerated treatment well Patient left: in chair;with call bell/phone within reach;with family/visitor present  GO     Jake Adams [phone] 03/20/2013, 10:55 AM

## 2013-03-20 NOTE — Progress Notes (Signed)
ANTICOAGULATION CONSULT NOTE - Follow Up Consult  Pharmacy Consult for Warfarin Indication: h/o PE and VTE prophylaxis following ortho surgery  Allergies  Allergen Reactions  . Warfarin And Related     CAUSED SEVERE DIARRHEA-- PT IS ABLE TO TAKE AND DOES TAKE COUMADIN    Patient Measurements: Height: 5\' 9"  (175.3 cm) (taken 03/12/13) Weight: 212 lb (96.163 kg) (taken 03/12/13) IBW/kg (Calculated) : 70.7  Vital Signs: Temp: 98.8 F (37.1 C) (05/17 0451) Temp src: Oral (05/17 0451) BP: 110/68 mmHg (05/17 1042) Pulse Rate: 86 (05/17 1042)  Labs:  Recent Labs  03/18/13 1035 03/19/13 0350 03/20/13 0503  HGB  --  10.6* 10.1*  HCT  --  32.9* 31.4*  PLT  --  151 159  LABPROT 13.6 14.7 17.3*  INR 1.05 1.17 1.46  CREATININE  --  0.69 0.62    Estimated Creatinine Clearance: 99.7 ml/min (by C-G formula based on Cr of 0.62).   Assessment: 44 yoM s/p left acetabular revision due to loose left THA 5/15.  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  PTA dose = 4 mg daily.  Warfarin resumed 5/15 PM.  Lovenox 30 mg q12h started 5/16 AM to bridge until INR >= 1.8.  INR 1.46, as expected with Coumadin re-initation. Hgb 10.1 following surgery.  No bleeding/complications reported  Goal of Therapy:  INR 2-3   Plan:   Patient with plan for discharge today.  PA Arsenio Loader ordered for Coumadin 6mg  daily and Lovenox 30 mg sq q12h at discharge.  Per communication with PA, will give Coumadin 6mg  po x 1 tonight then resume home dose tomorrow since patient will not have INR check until 2 weeks later.  Recommended patient to call MD office and consider getting earlier INR check so he does not have to be on Lovenox for too long if his INR is therapeutic.   RN and patient contacted and expressed understanding of plans  Geoffry Paradise, PharmD, BCPS Pager: [phone] 11:09 AM Pharmacy #: [phone]

## 2013-03-20 NOTE — Progress Notes (Signed)
Discharged from floor via w/c, spouse with pt. No changes in assessment. Jake Adams   

## 2013-03-20 NOTE — Progress Notes (Signed)
Physical Therapy Treatment Patient Details Name: Jake Adams MRN: [id number] DOB: [birth year] Today's Date: 03/20/2013 Time: 4098-1191 PT Time Calculation (min): 27 min  PT Assessment / Plan / Recommendation Comments on Treatment Session  Pt requires frequent cues for safety with RW, posterior hip precautions, PWB.  Pt is  DC'd today with HHPT.    Follow Up Recommendations  Home health PT     Does the patient have the potential to tolerate intense rehabilitation     Barriers to Discharge        Equipment Recommendations  Rolling walker with 5" wheels    Recommendations for Other Services    Frequency 7X/week   Plan Discharge plan remains appropriate    Precautions / Restrictions Precautions Precautions: Posterior Hip Precaution Comments: needs frequent reinforcement. Restrictions Weight Bearing Restrictions: Yes LLE Weight Bearing: Partial weight bearing LLE Partial Weight Bearing Percentage or Pounds: 50   Pertinent Vitals/Pain Sore     Mobility  Bed Mobility Supine to Sit: 6: Modified independent (Device/Increase time) Sit to Supine: 6: Modified independent (Device/Increase time) Transfers Sit to Stand: 5: Supervision Stand to Sit: 5: Supervision Details for Transfer Assistance: cues to not walk away from RW, Ambulation/Gait Ambulation/Gait Assistance: 5: Supervision Gait Pattern: Step-to pattern;Antalgic;Trunk flexed Gait velocity: cues to slow down General Gait Details: cues to position inside RW. Stairs: Yes Stairs Assistance: 4: Min assist Stairs Assistance Details (indicate cue type and reason): wife present, frequent cues for proper sequence, Pt performed incorrectly even with cues. Stair Management Technique: No rails;Backwards;With walker Number of Stairs: 1    Exercises     PT Diagnosis:    PT Problem List:   PT Treatment Interventions:     PT Goals Acute Rehab PT Goals Pt will go Supine/Side to Sit: with modified independence PT Goal:  Supine/Side to Sit - Progress: Met Pt will go Sit to Supine/Side: with modified independence PT Goal: Sit to Supine/Side - Progress: Met Pt will go Sit to Stand: with supervision PT Goal: Sit to Stand - Progress: Met Pt will go Stand to Sit: with supervision PT Goal: Stand to Sit - Progress: Met Pt will Ambulate: 51 - 150 feet;with supervision;with rolling walker PT Goal: Ambulate - Progress: Met Pt will Go Up / Down Stairs: 1-2 stairs;with supervision;with rolling walker PT Goal: Up/Down Stairs - Progress: Met Additional Goals Additional Goal #1: state 3/3 post hip precautions. PT Goal: Additional Goal #1 - Progress: Progressing toward goal  Visit Information  Last PT Received On: 03/20/13 Assistance Needed: +1    Subjective Data  Subjective: I need that yelolwpillow. I roll onto my side.   Cognition  Cognition Arousal/Alertness: Awake/alert Behavior During Therapy: Impulsive    Balance     End of Session PT - End of Session Activity Tolerance: Patient tolerated treatment well Patient left: in bed;with family/visitor present;with call bell/phone within reach Nurse Communication: Mobility status   GP     Jake Adams 03/20/2013, 10:14 AM Blanchard Kelch PT [phone]

## 2013-03-20 NOTE — Progress Notes (Signed)
Subjective: 2 Days Post-Op Procedure(s) (LRB): LEFT TOTAL HIP REVISION (Left) Patient reports pain is well controlled. Denies SOB and CP. Passing flatus. Patient states he is looking forward to going home. Objective: Vital signs in last 24 hours: Temp:  [98.2 F (36.8 C)-98.8 F (37.1 C)] 98.8 F (37.1 C) (05/17 0451) Pulse Rate:  [74-85] 85 (05/17 0451) Resp:  [15-16] 16 (05/17 0451) BP: (91-99)/(54-63) 91/54 mmHg (05/17 0451) SpO2:  [95 %-98 %] 97 % (05/17 0451)  Intake/Output from previous day: 05/16 0701 - 05/17 0700 In: 2286.7 [P.O.:720; I.V.:1366.7; IV Piggyback:200] Out: 2025 [Urine:1750; Drains:275] Intake/Output this shift:     Recent Labs  03/19/13 0350 03/20/13 0503  HGB 10.6* 10.1*    Recent Labs  03/19/13 0350 03/20/13 0503  WBC 7.3 12.2*  RBC 3.94* 3.77*  HCT 32.9* 31.4*  PLT 151 159    Recent Labs  03/19/13 0350 03/20/13 0503  NA 138 136  K 4.2 3.4*  CL 106 103  CO2 28 27  BUN 6 11  CREATININE 0.69 0.62  GLUCOSE 107* 132*  CALCIUM 8.7 9.2    Recent Labs  03/19/13 0350 03/20/13 0503  INR 1.17 1.46    Well nourished. Alert and oriented. RRR. Clear lung fields. postivie BS x4. Dressing D/C/I on left hip. Changed dressing today. Left lower extremity neurovascularly intact. Left calf soft and nontender.  Assessment/Plan: 2 Days Post-Op Procedure(s) (LRB): LEFT TOTAL HIP REVISION (Left) D/C home today with family help. Follow discharge instructions and take medication as directed.  Change dressing today.  Start home PT Monday. F/u in office in two weeks.  Judd Mccubbin L 03/20/2013, 7:52 AM

## 2013-03-20 NOTE — Care Management Note (Signed)
Per PA Pt to follow up with Dr.Aronson on 03/25/13 where PT/INR is followed. No HHRN required for lab draws. DME present in room prior to dc. No other needs identified.   Roxy Manns Eligio Angert,RN,BSN [phone]

## 2013-03-21 DIAGNOSIS — Z7901 Long term (current) use of anticoagulants: Secondary | ICD-10-CM | POA: Diagnosis not present

## 2013-03-21 DIAGNOSIS — Z96649 Presence of unspecified artificial hip joint: Secondary | ICD-10-CM | POA: Diagnosis not present

## 2013-03-21 DIAGNOSIS — I10 Essential (primary) hypertension: Secondary | ICD-10-CM | POA: Diagnosis not present

## 2013-03-21 DIAGNOSIS — K219 Gastro-esophageal reflux disease without esophagitis: Secondary | ICD-10-CM | POA: Diagnosis not present

## 2013-03-21 DIAGNOSIS — M159 Polyosteoarthritis, unspecified: Secondary | ICD-10-CM | POA: Diagnosis not present

## 2013-03-21 DIAGNOSIS — Z5181 Encounter for therapeutic drug level monitoring: Secondary | ICD-10-CM | POA: Diagnosis not present

## 2013-03-21 DIAGNOSIS — Z86711 Personal history of pulmonary embolism: Secondary | ICD-10-CM | POA: Diagnosis not present

## 2013-03-21 DIAGNOSIS — Z471 Aftercare following joint replacement surgery: Secondary | ICD-10-CM | POA: Diagnosis not present

## 2013-03-23 DIAGNOSIS — Z5181 Encounter for therapeutic drug level monitoring: Secondary | ICD-10-CM | POA: Diagnosis not present

## 2013-03-23 DIAGNOSIS — Z471 Aftercare following joint replacement surgery: Secondary | ICD-10-CM | POA: Diagnosis not present

## 2013-03-23 DIAGNOSIS — I10 Essential (primary) hypertension: Secondary | ICD-10-CM | POA: Diagnosis not present

## 2013-03-23 DIAGNOSIS — Z7901 Long term (current) use of anticoagulants: Secondary | ICD-10-CM | POA: Diagnosis not present

## 2013-03-23 DIAGNOSIS — K219 Gastro-esophageal reflux disease without esophagitis: Secondary | ICD-10-CM | POA: Diagnosis not present

## 2013-03-23 DIAGNOSIS — M159 Polyosteoarthritis, unspecified: Secondary | ICD-10-CM | POA: Diagnosis not present

## 2013-03-25 DIAGNOSIS — I2699 Other pulmonary embolism without acute cor pulmonale: Secondary | ICD-10-CM | POA: Diagnosis not present

## 2013-03-25 DIAGNOSIS — Z7901 Long term (current) use of anticoagulants: Secondary | ICD-10-CM | POA: Diagnosis not present

## 2013-03-25 DIAGNOSIS — I10 Essential (primary) hypertension: Secondary | ICD-10-CM | POA: Diagnosis not present

## 2013-03-25 DIAGNOSIS — M159 Polyosteoarthritis, unspecified: Secondary | ICD-10-CM | POA: Diagnosis not present

## 2013-03-25 DIAGNOSIS — Z5181 Encounter for therapeutic drug level monitoring: Secondary | ICD-10-CM | POA: Diagnosis not present

## 2013-03-25 DIAGNOSIS — K219 Gastro-esophageal reflux disease without esophagitis: Secondary | ICD-10-CM | POA: Diagnosis not present

## 2013-03-25 DIAGNOSIS — Z471 Aftercare following joint replacement surgery: Secondary | ICD-10-CM | POA: Diagnosis not present

## 2013-03-26 DIAGNOSIS — Z471 Aftercare following joint replacement surgery: Secondary | ICD-10-CM | POA: Diagnosis not present

## 2013-03-26 DIAGNOSIS — I10 Essential (primary) hypertension: Secondary | ICD-10-CM | POA: Diagnosis not present

## 2013-03-26 DIAGNOSIS — Z7901 Long term (current) use of anticoagulants: Secondary | ICD-10-CM | POA: Diagnosis not present

## 2013-03-26 DIAGNOSIS — K219 Gastro-esophageal reflux disease without esophagitis: Secondary | ICD-10-CM | POA: Diagnosis not present

## 2013-03-26 DIAGNOSIS — M159 Polyosteoarthritis, unspecified: Secondary | ICD-10-CM | POA: Diagnosis not present

## 2013-03-26 DIAGNOSIS — Z5181 Encounter for therapeutic drug level monitoring: Secondary | ICD-10-CM | POA: Diagnosis not present

## 2013-03-29 DIAGNOSIS — I10 Essential (primary) hypertension: Secondary | ICD-10-CM | POA: Diagnosis not present

## 2013-03-29 DIAGNOSIS — Z5181 Encounter for therapeutic drug level monitoring: Secondary | ICD-10-CM | POA: Diagnosis not present

## 2013-03-29 DIAGNOSIS — M159 Polyosteoarthritis, unspecified: Secondary | ICD-10-CM | POA: Diagnosis not present

## 2013-03-29 DIAGNOSIS — Z7901 Long term (current) use of anticoagulants: Secondary | ICD-10-CM | POA: Diagnosis not present

## 2013-03-29 DIAGNOSIS — K219 Gastro-esophageal reflux disease without esophagitis: Secondary | ICD-10-CM | POA: Diagnosis not present

## 2013-03-29 DIAGNOSIS — Z471 Aftercare following joint replacement surgery: Secondary | ICD-10-CM | POA: Diagnosis not present

## 2013-03-31 DIAGNOSIS — Z471 Aftercare following joint replacement surgery: Secondary | ICD-10-CM | POA: Diagnosis not present

## 2013-03-31 DIAGNOSIS — M159 Polyosteoarthritis, unspecified: Secondary | ICD-10-CM | POA: Diagnosis not present

## 2013-03-31 DIAGNOSIS — Z5181 Encounter for therapeutic drug level monitoring: Secondary | ICD-10-CM | POA: Diagnosis not present

## 2013-03-31 DIAGNOSIS — I10 Essential (primary) hypertension: Secondary | ICD-10-CM | POA: Diagnosis not present

## 2013-03-31 DIAGNOSIS — K219 Gastro-esophageal reflux disease without esophagitis: Secondary | ICD-10-CM | POA: Diagnosis not present

## 2013-03-31 DIAGNOSIS — Z7901 Long term (current) use of anticoagulants: Secondary | ICD-10-CM | POA: Diagnosis not present

## 2013-04-02 DIAGNOSIS — I10 Essential (primary) hypertension: Secondary | ICD-10-CM | POA: Diagnosis not present

## 2013-04-02 DIAGNOSIS — Z5181 Encounter for therapeutic drug level monitoring: Secondary | ICD-10-CM | POA: Diagnosis not present

## 2013-04-02 DIAGNOSIS — M159 Polyosteoarthritis, unspecified: Secondary | ICD-10-CM | POA: Diagnosis not present

## 2013-04-02 DIAGNOSIS — K219 Gastro-esophageal reflux disease without esophagitis: Secondary | ICD-10-CM | POA: Diagnosis not present

## 2013-04-02 DIAGNOSIS — Z471 Aftercare following joint replacement surgery: Secondary | ICD-10-CM | POA: Diagnosis not present

## 2013-04-02 DIAGNOSIS — Z7901 Long term (current) use of anticoagulants: Secondary | ICD-10-CM | POA: Diagnosis not present

## 2013-04-05 DIAGNOSIS — Z7901 Long term (current) use of anticoagulants: Secondary | ICD-10-CM | POA: Diagnosis not present

## 2013-04-05 DIAGNOSIS — K219 Gastro-esophageal reflux disease without esophagitis: Secondary | ICD-10-CM | POA: Diagnosis not present

## 2013-04-05 DIAGNOSIS — Z471 Aftercare following joint replacement surgery: Secondary | ICD-10-CM | POA: Diagnosis not present

## 2013-04-05 DIAGNOSIS — I10 Essential (primary) hypertension: Secondary | ICD-10-CM | POA: Diagnosis not present

## 2013-04-05 DIAGNOSIS — M159 Polyosteoarthritis, unspecified: Secondary | ICD-10-CM | POA: Diagnosis not present

## 2013-04-05 DIAGNOSIS — Z5181 Encounter for therapeutic drug level monitoring: Secondary | ICD-10-CM | POA: Diagnosis not present

## 2013-04-06 DIAGNOSIS — M159 Polyosteoarthritis, unspecified: Secondary | ICD-10-CM | POA: Diagnosis not present

## 2013-04-06 DIAGNOSIS — K219 Gastro-esophageal reflux disease without esophagitis: Secondary | ICD-10-CM | POA: Diagnosis not present

## 2013-04-06 DIAGNOSIS — Z5181 Encounter for therapeutic drug level monitoring: Secondary | ICD-10-CM | POA: Diagnosis not present

## 2013-04-06 DIAGNOSIS — Z7901 Long term (current) use of anticoagulants: Secondary | ICD-10-CM | POA: Diagnosis not present

## 2013-04-06 DIAGNOSIS — I10 Essential (primary) hypertension: Secondary | ICD-10-CM | POA: Diagnosis not present

## 2013-04-06 DIAGNOSIS — Z471 Aftercare following joint replacement surgery: Secondary | ICD-10-CM | POA: Diagnosis not present

## 2013-04-07 DIAGNOSIS — I10 Essential (primary) hypertension: Secondary | ICD-10-CM | POA: Diagnosis not present

## 2013-04-07 DIAGNOSIS — Z471 Aftercare following joint replacement surgery: Secondary | ICD-10-CM | POA: Diagnosis not present

## 2013-04-07 DIAGNOSIS — M159 Polyosteoarthritis, unspecified: Secondary | ICD-10-CM | POA: Diagnosis not present

## 2013-04-07 DIAGNOSIS — Z7901 Long term (current) use of anticoagulants: Secondary | ICD-10-CM | POA: Diagnosis not present

## 2013-04-07 DIAGNOSIS — Z5181 Encounter for therapeutic drug level monitoring: Secondary | ICD-10-CM | POA: Diagnosis not present

## 2013-04-07 DIAGNOSIS — K219 Gastro-esophageal reflux disease without esophagitis: Secondary | ICD-10-CM | POA: Diagnosis not present

## 2013-04-09 DIAGNOSIS — K219 Gastro-esophageal reflux disease without esophagitis: Secondary | ICD-10-CM | POA: Diagnosis not present

## 2013-04-09 DIAGNOSIS — Z5181 Encounter for therapeutic drug level monitoring: Secondary | ICD-10-CM | POA: Diagnosis not present

## 2013-04-09 DIAGNOSIS — I10 Essential (primary) hypertension: Secondary | ICD-10-CM | POA: Diagnosis not present

## 2013-04-09 DIAGNOSIS — Z7901 Long term (current) use of anticoagulants: Secondary | ICD-10-CM | POA: Diagnosis not present

## 2013-04-09 DIAGNOSIS — Z471 Aftercare following joint replacement surgery: Secondary | ICD-10-CM | POA: Diagnosis not present

## 2013-04-09 DIAGNOSIS — M159 Polyosteoarthritis, unspecified: Secondary | ICD-10-CM | POA: Diagnosis not present

## 2013-04-12 DIAGNOSIS — Z7901 Long term (current) use of anticoagulants: Secondary | ICD-10-CM | POA: Diagnosis not present

## 2013-04-12 DIAGNOSIS — Z471 Aftercare following joint replacement surgery: Secondary | ICD-10-CM | POA: Diagnosis not present

## 2013-04-12 DIAGNOSIS — K219 Gastro-esophageal reflux disease without esophagitis: Secondary | ICD-10-CM | POA: Diagnosis not present

## 2013-04-12 DIAGNOSIS — I10 Essential (primary) hypertension: Secondary | ICD-10-CM | POA: Diagnosis not present

## 2013-04-12 DIAGNOSIS — M159 Polyosteoarthritis, unspecified: Secondary | ICD-10-CM | POA: Diagnosis not present

## 2013-04-12 DIAGNOSIS — Z5181 Encounter for therapeutic drug level monitoring: Secondary | ICD-10-CM | POA: Diagnosis not present

## 2013-04-15 DIAGNOSIS — M159 Polyosteoarthritis, unspecified: Secondary | ICD-10-CM | POA: Diagnosis not present

## 2013-04-15 DIAGNOSIS — K219 Gastro-esophageal reflux disease without esophagitis: Secondary | ICD-10-CM | POA: Diagnosis not present

## 2013-04-15 DIAGNOSIS — I10 Essential (primary) hypertension: Secondary | ICD-10-CM | POA: Diagnosis not present

## 2013-04-15 DIAGNOSIS — Z7901 Long term (current) use of anticoagulants: Secondary | ICD-10-CM | POA: Diagnosis not present

## 2013-04-15 DIAGNOSIS — Z471 Aftercare following joint replacement surgery: Secondary | ICD-10-CM | POA: Diagnosis not present

## 2013-04-15 DIAGNOSIS — Z5181 Encounter for therapeutic drug level monitoring: Secondary | ICD-10-CM | POA: Diagnosis not present

## 2013-04-16 DIAGNOSIS — Z471 Aftercare following joint replacement surgery: Secondary | ICD-10-CM | POA: Diagnosis not present

## 2013-04-16 DIAGNOSIS — Z7901 Long term (current) use of anticoagulants: Secondary | ICD-10-CM | POA: Diagnosis not present

## 2013-04-16 DIAGNOSIS — I10 Essential (primary) hypertension: Secondary | ICD-10-CM | POA: Diagnosis not present

## 2013-04-16 DIAGNOSIS — K219 Gastro-esophageal reflux disease without esophagitis: Secondary | ICD-10-CM | POA: Diagnosis not present

## 2013-04-16 DIAGNOSIS — Z5181 Encounter for therapeutic drug level monitoring: Secondary | ICD-10-CM | POA: Diagnosis not present

## 2013-04-16 DIAGNOSIS — M159 Polyosteoarthritis, unspecified: Secondary | ICD-10-CM | POA: Diagnosis not present

## 2013-04-27 DIAGNOSIS — Z96649 Presence of unspecified artificial hip joint: Secondary | ICD-10-CM | POA: Diagnosis not present

## 2013-04-28 DIAGNOSIS — Z7901 Long term (current) use of anticoagulants: Secondary | ICD-10-CM | POA: Diagnosis not present

## 2013-04-28 DIAGNOSIS — Z5181 Encounter for therapeutic drug level monitoring: Secondary | ICD-10-CM | POA: Diagnosis not present

## 2013-04-28 DIAGNOSIS — K219 Gastro-esophageal reflux disease without esophagitis: Secondary | ICD-10-CM | POA: Diagnosis not present

## 2013-04-28 DIAGNOSIS — I10 Essential (primary) hypertension: Secondary | ICD-10-CM | POA: Diagnosis not present

## 2013-04-28 DIAGNOSIS — M159 Polyosteoarthritis, unspecified: Secondary | ICD-10-CM | POA: Diagnosis not present

## 2013-04-28 DIAGNOSIS — Z471 Aftercare following joint replacement surgery: Secondary | ICD-10-CM | POA: Diagnosis not present

## 2013-05-04 DIAGNOSIS — M159 Polyosteoarthritis, unspecified: Secondary | ICD-10-CM | POA: Diagnosis not present

## 2013-05-04 DIAGNOSIS — I10 Essential (primary) hypertension: Secondary | ICD-10-CM | POA: Diagnosis not present

## 2013-05-04 DIAGNOSIS — Z7901 Long term (current) use of anticoagulants: Secondary | ICD-10-CM | POA: Diagnosis not present

## 2013-05-04 DIAGNOSIS — K219 Gastro-esophageal reflux disease without esophagitis: Secondary | ICD-10-CM | POA: Diagnosis not present

## 2013-05-04 DIAGNOSIS — Z5181 Encounter for therapeutic drug level monitoring: Secondary | ICD-10-CM | POA: Diagnosis not present

## 2013-05-04 DIAGNOSIS — Z471 Aftercare following joint replacement surgery: Secondary | ICD-10-CM | POA: Diagnosis not present

## 2013-05-06 DIAGNOSIS — M159 Polyosteoarthritis, unspecified: Secondary | ICD-10-CM | POA: Diagnosis not present

## 2013-05-06 DIAGNOSIS — Z471 Aftercare following joint replacement surgery: Secondary | ICD-10-CM | POA: Diagnosis not present

## 2013-05-06 DIAGNOSIS — I10 Essential (primary) hypertension: Secondary | ICD-10-CM | POA: Diagnosis not present

## 2013-05-06 DIAGNOSIS — Z7901 Long term (current) use of anticoagulants: Secondary | ICD-10-CM | POA: Diagnosis not present

## 2013-05-06 DIAGNOSIS — Z5181 Encounter for therapeutic drug level monitoring: Secondary | ICD-10-CM | POA: Diagnosis not present

## 2013-05-06 DIAGNOSIS — K219 Gastro-esophageal reflux disease without esophagitis: Secondary | ICD-10-CM | POA: Diagnosis not present

## 2013-05-12 DIAGNOSIS — Z7901 Long term (current) use of anticoagulants: Secondary | ICD-10-CM | POA: Diagnosis not present

## 2013-05-12 DIAGNOSIS — I2699 Other pulmonary embolism without acute cor pulmonale: Secondary | ICD-10-CM | POA: Diagnosis not present

## 2013-06-08 DIAGNOSIS — Z96649 Presence of unspecified artificial hip joint: Secondary | ICD-10-CM | POA: Diagnosis not present

## 2013-06-16 DIAGNOSIS — I2699 Other pulmonary embolism without acute cor pulmonale: Secondary | ICD-10-CM | POA: Diagnosis not present

## 2013-06-16 DIAGNOSIS — Z7901 Long term (current) use of anticoagulants: Secondary | ICD-10-CM | POA: Diagnosis not present

## 2013-07-27 DIAGNOSIS — Z23 Encounter for immunization: Secondary | ICD-10-CM | POA: Diagnosis not present

## 2013-07-27 DIAGNOSIS — Z7901 Long term (current) use of anticoagulants: Secondary | ICD-10-CM | POA: Diagnosis not present

## 2013-07-27 DIAGNOSIS — I2699 Other pulmonary embolism without acute cor pulmonale: Secondary | ICD-10-CM | POA: Diagnosis not present

## 2013-09-01 IMAGING — CT CT CHEST W/O CM
4 of 6 series · 18 of 30 positions shown, 19 images · non-contrast
Comparison: Chest CT 08/11/2010

CLINICAL DATA: Abnormal lung nodule

CT CHEST WITHOUT CONTRAST
TECHNIQUE: Multidetector CT imaging of the chest was performed
following the standard protocol without IV contrast.

[Series 3: chest w/o · axial · non-contrast · 0.76mm/px · z∈[-383,-38]mm · 3 of 70 slices shown, 4 images]
[im 1/70  mediastinal]
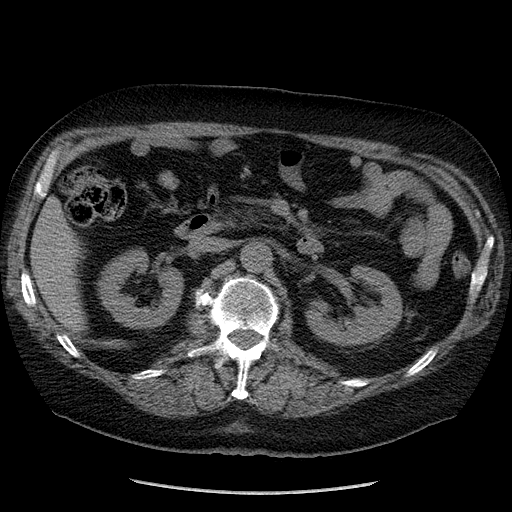
[im 1/70  lung]
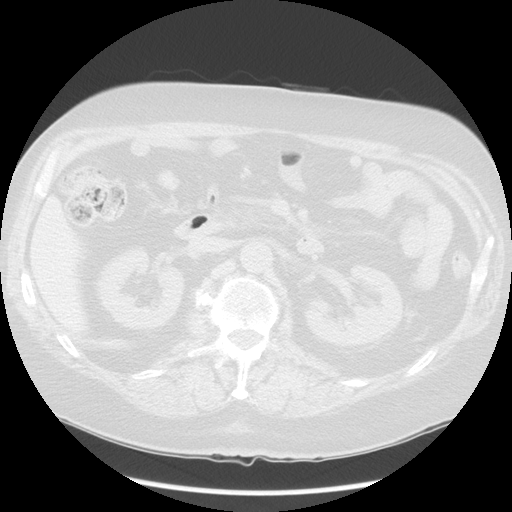
[im 35/70  lung]
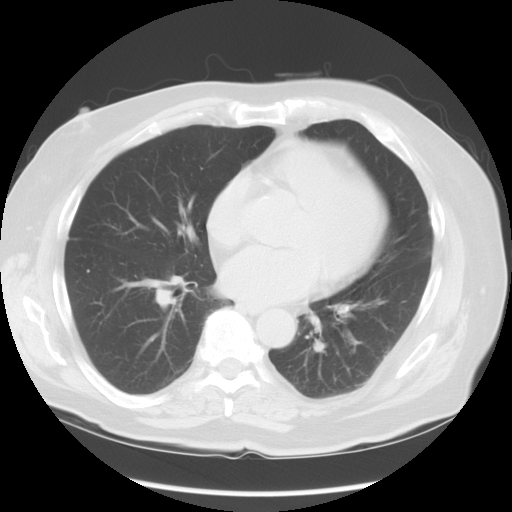
[im 70/70  lung]
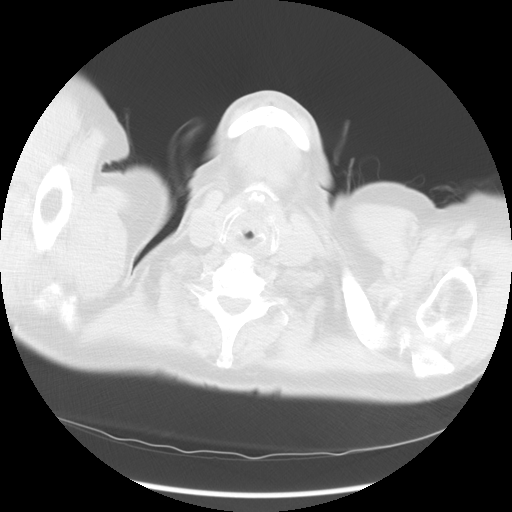

[Series 200: cor · coronal · 0.76mm/px · 5 of 159 slices shown]
[im 27/159  lung]
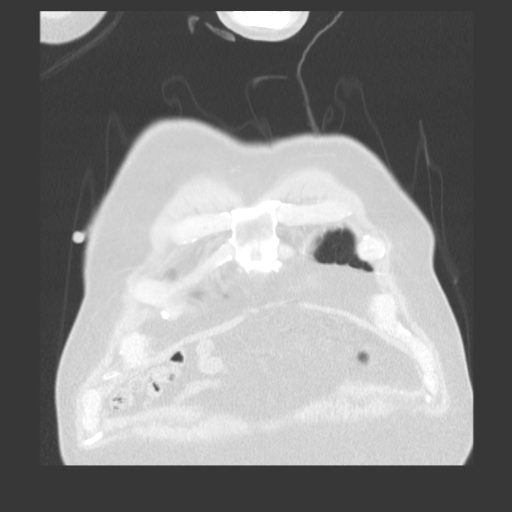
[im 53/159  lung]
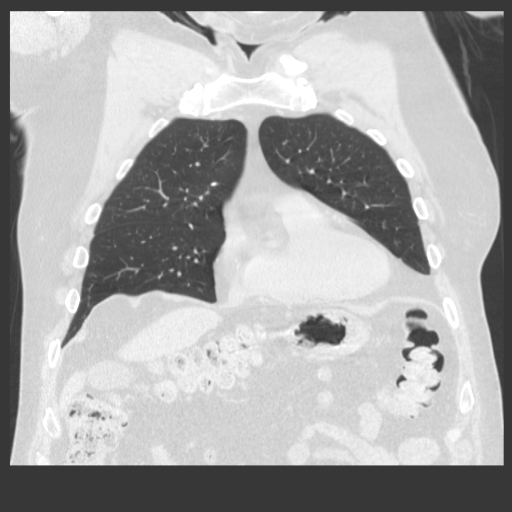
[im 80/159  lung]
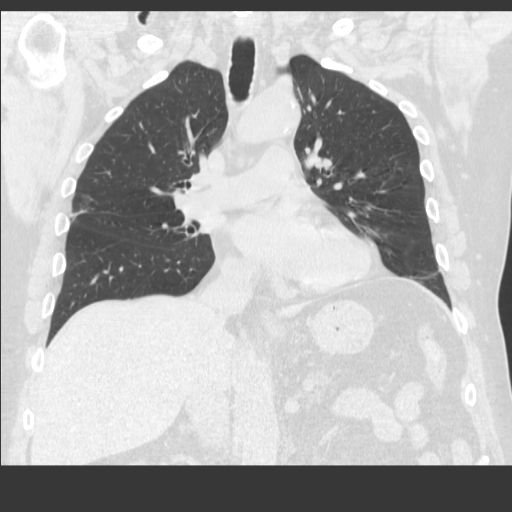
[im 106/159  lung]
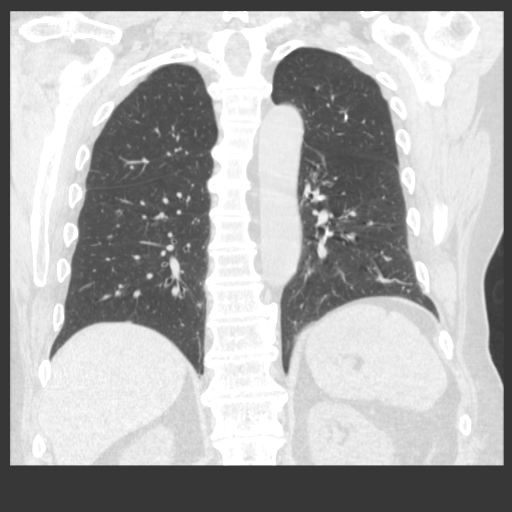
[im 132/159  lung]
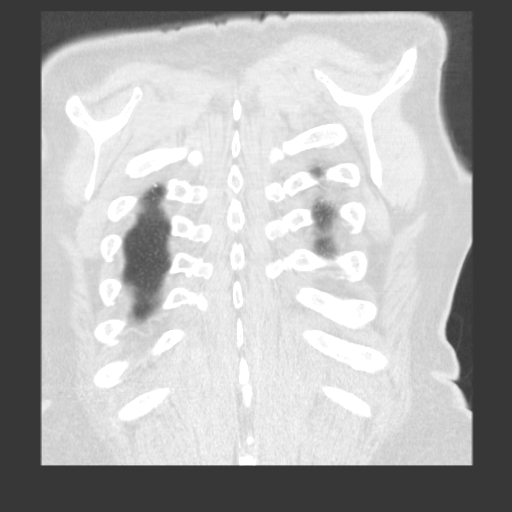

[Series 201: sag · sagittal · 0.76mm/px · 6 of 182 slices shown]
[im 26/182  lung]
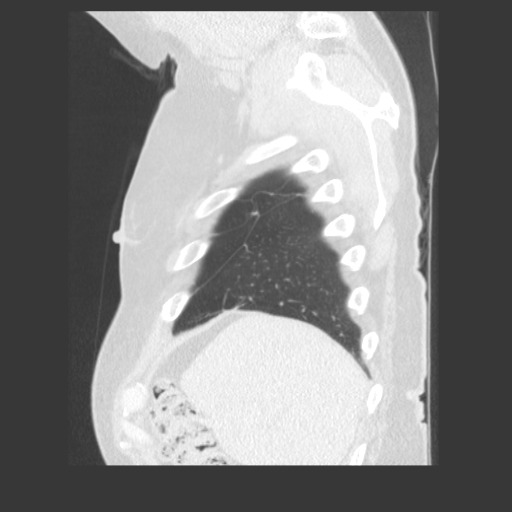
[im 52/182  lung]
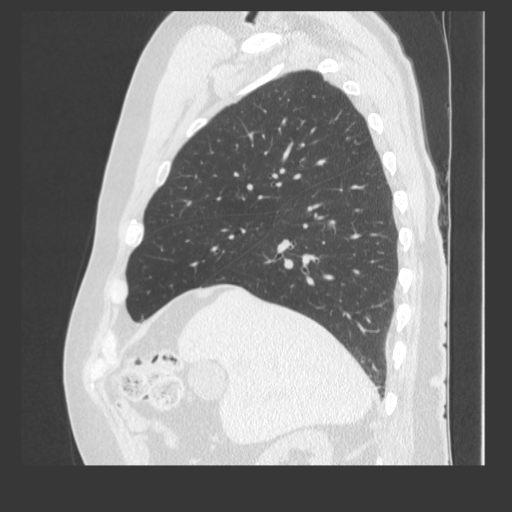
[im 78/182  lung]
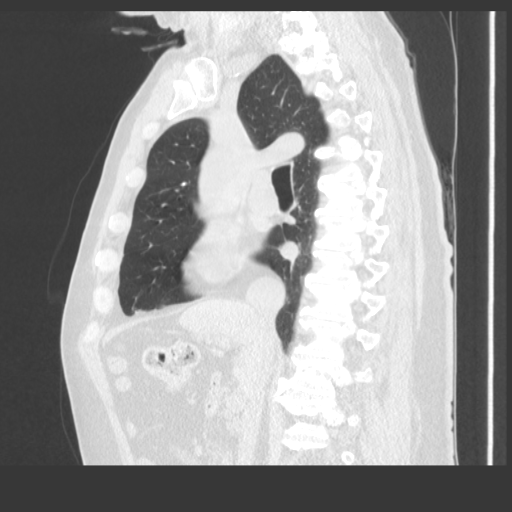
[im 104/182  lung]
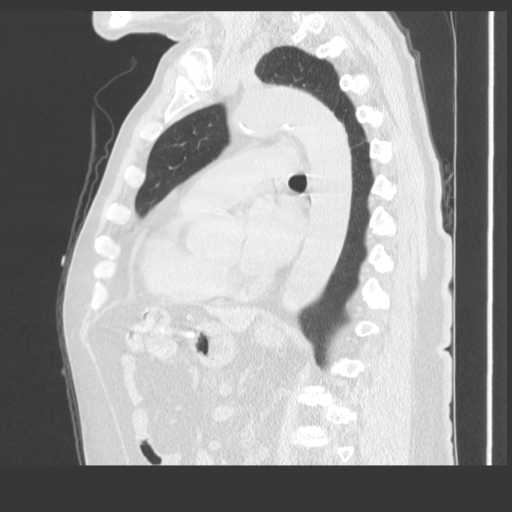
[im 130/182  lung]
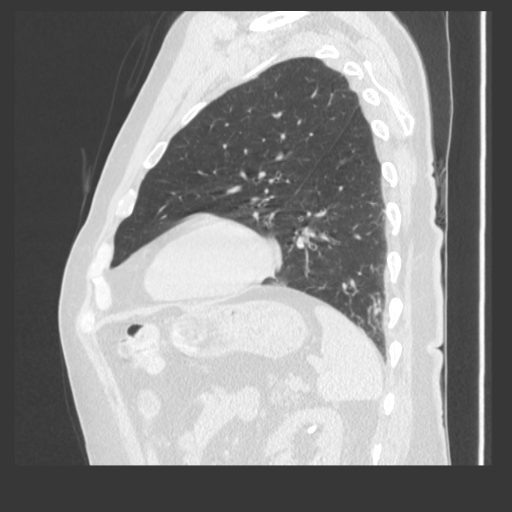
[im 156/182  lung]
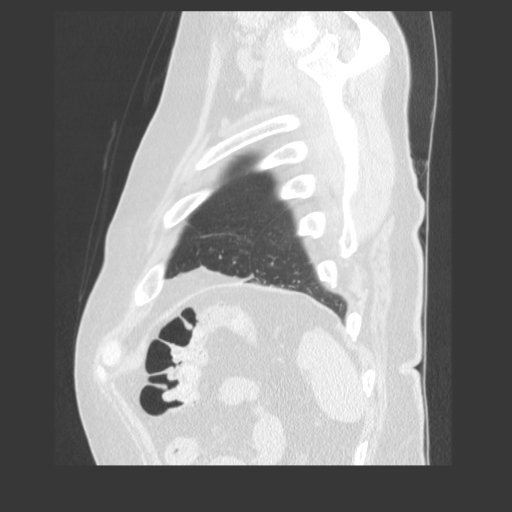

[Series 602: sagittal body · sagittal · 0.76mm/px · 4 of 157 slices shown]
[im 27/157  mediastinal]
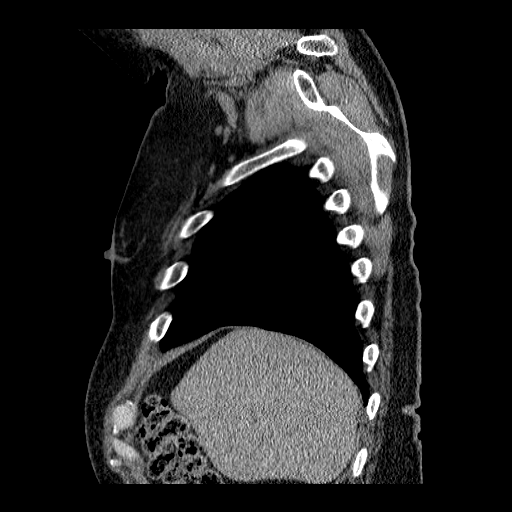
[im 53/157  mediastinal]
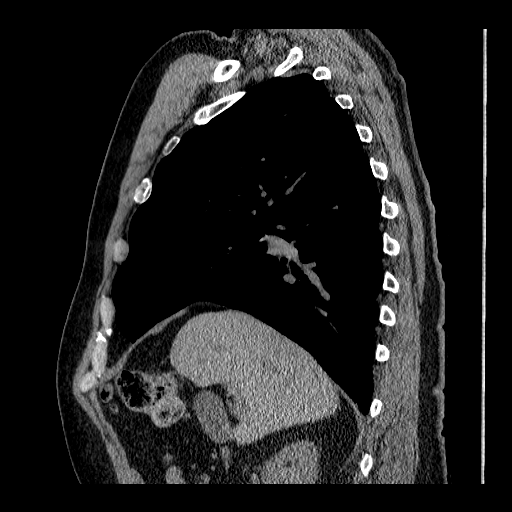
[im 79/157  mediastinal]
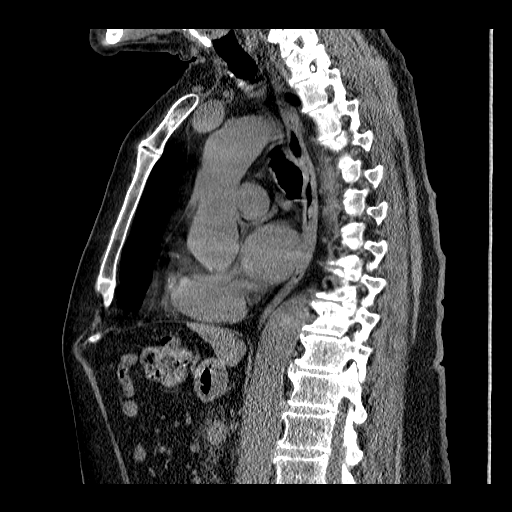
[im 105/157  mediastinal]
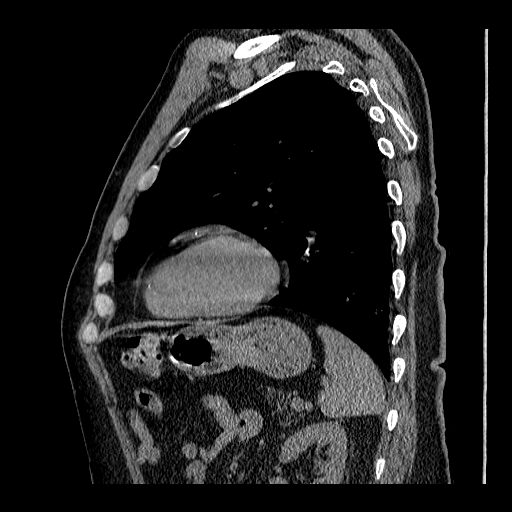

[18 of 30 positions shown; findings below may reference images not displayed]

FINDINGS: No pleural effusion is identified.  No airspace
consolidation identified.  There are scattered areas of tree in bud
nodularity within the right middle lobe and basilar portion of the
right upper lobe, image 30/series 4 and image 44/series 4. No
suspicious pulmonary parenchymal nodule or mass identified.

Heart size appears normal.  No pericardial effusion.
Calcifications involving the LAD coronary artery is identified. No
mediastinal or hilar adenopathy noted.

Imaging through the upper abdomen demonstrate a 7 mm stone within
the upper pole the left kidney.  No acute findings identified.

Review of the visualized osseous structures is significant for mild
degenerative disc disease.
IMPRESSION: 1.  No acute findings.
2.  Clustered areas of peripheral tree in bud nodularity within the
right lung are likely the sequela of chronic, indolent atypical
infection.
3.  Coronary artery calcifications.

## 2013-09-07 DIAGNOSIS — Z96649 Presence of unspecified artificial hip joint: Secondary | ICD-10-CM | POA: Diagnosis not present

## 2013-09-07 DIAGNOSIS — M169 Osteoarthritis of hip, unspecified: Secondary | ICD-10-CM | POA: Diagnosis not present

## 2013-09-07 DIAGNOSIS — Z7901 Long term (current) use of anticoagulants: Secondary | ICD-10-CM | POA: Diagnosis not present

## 2013-09-07 DIAGNOSIS — I2699 Other pulmonary embolism without acute cor pulmonale: Secondary | ICD-10-CM | POA: Diagnosis not present

## 2013-10-20 DIAGNOSIS — Z7901 Long term (current) use of anticoagulants: Secondary | ICD-10-CM | POA: Diagnosis not present

## 2013-10-20 DIAGNOSIS — I2699 Other pulmonary embolism without acute cor pulmonale: Secondary | ICD-10-CM | POA: Diagnosis not present

## 2013-12-01 DIAGNOSIS — Z7901 Long term (current) use of anticoagulants: Secondary | ICD-10-CM | POA: Diagnosis not present

## 2013-12-01 DIAGNOSIS — I2699 Other pulmonary embolism without acute cor pulmonale: Secondary | ICD-10-CM | POA: Diagnosis not present

## 2013-12-10 ENCOUNTER — Encounter: Payer: Self-pay | Admitting: Internal Medicine

## 2014-01-18 DIAGNOSIS — I2699 Other pulmonary embolism without acute cor pulmonale: Secondary | ICD-10-CM | POA: Diagnosis not present

## 2014-01-18 DIAGNOSIS — Z7901 Long term (current) use of anticoagulants: Secondary | ICD-10-CM | POA: Diagnosis not present

## 2014-02-25 DIAGNOSIS — I2699 Other pulmonary embolism without acute cor pulmonale: Secondary | ICD-10-CM | POA: Diagnosis not present

## 2014-02-25 DIAGNOSIS — E785 Hyperlipidemia, unspecified: Secondary | ICD-10-CM | POA: Diagnosis not present

## 2014-02-25 DIAGNOSIS — M1990 Unspecified osteoarthritis, unspecified site: Secondary | ICD-10-CM | POA: Diagnosis not present

## 2014-02-25 DIAGNOSIS — Z6827 Body mass index (BMI) 27.0-27.9, adult: Secondary | ICD-10-CM | POA: Diagnosis not present

## 2014-02-25 DIAGNOSIS — Z7901 Long term (current) use of anticoagulants: Secondary | ICD-10-CM | POA: Diagnosis not present

## 2014-02-25 DIAGNOSIS — I10 Essential (primary) hypertension: Secondary | ICD-10-CM | POA: Diagnosis not present

## 2014-02-25 DIAGNOSIS — K219 Gastro-esophageal reflux disease without esophagitis: Secondary | ICD-10-CM | POA: Diagnosis not present

## 2014-04-21 DIAGNOSIS — Z7901 Long term (current) use of anticoagulants: Secondary | ICD-10-CM | POA: Diagnosis not present

## 2014-04-21 DIAGNOSIS — I2699 Other pulmonary embolism without acute cor pulmonale: Secondary | ICD-10-CM | POA: Diagnosis not present

## 2014-05-11 DIAGNOSIS — H6120 Impacted cerumen, unspecified ear: Secondary | ICD-10-CM | POA: Diagnosis not present

## 2014-05-19 ENCOUNTER — Encounter: Payer: Self-pay | Admitting: Internal Medicine

## 2014-06-13 DIAGNOSIS — E785 Hyperlipidemia, unspecified: Secondary | ICD-10-CM | POA: Diagnosis not present

## 2014-06-13 DIAGNOSIS — I10 Essential (primary) hypertension: Secondary | ICD-10-CM | POA: Diagnosis not present

## 2014-06-13 DIAGNOSIS — M1990 Unspecified osteoarthritis, unspecified site: Secondary | ICD-10-CM | POA: Diagnosis not present

## 2014-06-13 DIAGNOSIS — Z6828 Body mass index (BMI) 28.0-28.9, adult: Secondary | ICD-10-CM | POA: Diagnosis not present

## 2014-06-13 DIAGNOSIS — Z1331 Encounter for screening for depression: Secondary | ICD-10-CM | POA: Diagnosis not present

## 2014-06-13 DIAGNOSIS — K219 Gastro-esophageal reflux disease without esophagitis: Secondary | ICD-10-CM | POA: Diagnosis not present

## 2014-07-25 ENCOUNTER — Emergency Department (HOSPITAL_COMMUNITY)
Admission: EM | Admit: 2014-07-25 | Discharge: 2014-07-25 | Disposition: A | Discharge status: Home / Self Care | Payer: Medicare Other | Attending: Emergency Medicine | Admitting: Emergency Medicine

## 2014-07-25 ENCOUNTER — Encounter (HOSPITAL_COMMUNITY): Payer: Self-pay | Admitting: Emergency Medicine

## 2014-07-25 DIAGNOSIS — R059 Cough, unspecified: Secondary | ICD-10-CM | POA: Insufficient documentation

## 2014-07-25 DIAGNOSIS — I10 Essential (primary) hypertension: Secondary | ICD-10-CM | POA: Insufficient documentation

## 2014-07-25 DIAGNOSIS — Z87891 Personal history of nicotine dependence: Secondary | ICD-10-CM | POA: Diagnosis not present

## 2014-07-25 DIAGNOSIS — Z79899 Other long term (current) drug therapy: Secondary | ICD-10-CM | POA: Diagnosis not present

## 2014-07-25 DIAGNOSIS — Z86711 Personal history of pulmonary embolism: Secondary | ICD-10-CM | POA: Insufficient documentation

## 2014-07-25 DIAGNOSIS — Z880 Allergy status to penicillin: Secondary | ICD-10-CM | POA: Diagnosis not present

## 2014-07-25 DIAGNOSIS — Z87448 Personal history of other diseases of urinary system: Secondary | ICD-10-CM | POA: Insufficient documentation

## 2014-07-25 DIAGNOSIS — M1990 Unspecified osteoarthritis, unspecified site: Secondary | ICD-10-CM | POA: Diagnosis not present

## 2014-07-25 DIAGNOSIS — E785 Hyperlipidemia, unspecified: Secondary | ICD-10-CM | POA: Insufficient documentation

## 2014-07-25 DIAGNOSIS — K219 Gastro-esophageal reflux disease without esophagitis: Secondary | ICD-10-CM | POA: Insufficient documentation

## 2014-07-25 DIAGNOSIS — Z7901 Long term (current) use of anticoagulants: Secondary | ICD-10-CM | POA: Insufficient documentation

## 2014-07-25 DIAGNOSIS — I4949 Other premature depolarization: Secondary | ICD-10-CM | POA: Insufficient documentation

## 2014-07-25 DIAGNOSIS — R040 Epistaxis: Secondary | ICD-10-CM | POA: Diagnosis not present

## 2014-07-25 DIAGNOSIS — F411 Generalized anxiety disorder: Secondary | ICD-10-CM | POA: Diagnosis not present

## 2014-07-25 DIAGNOSIS — R05 Cough: Secondary | ICD-10-CM | POA: Diagnosis not present

## 2014-07-25 LAB — PROTIME-INR
INR: 2.42 — ABNORMAL HIGH (ref 0.00–1.49)
Prothrombin Time: 26.3 seconds — ABNORMAL HIGH (ref 11.6–15.2)

## 2014-07-25 MED ORDER — OXYMETAZOLINE HCL 0.05 % NA SOLN
NASAL | Status: AC
  Administered 2014-07-25: 13:00:00
  Filled 2014-07-25: qty 15

## 2014-07-25 MED ORDER — OXYMETAZOLINE HCL 0.05 % NA SOLN: 1.0000 | Freq: Once | NASAL | Status: DC

## 2014-07-25 NOTE — ED Notes (Signed)
Pt reports intermittent nose bleed since 830 am. No active bleeding noted in triage. Pt reports is on coumadin. nad noted. Airway patent.

## 2014-07-25 NOTE — ED Notes (Signed)
Pt had nosebleed from bil nares  830 am. No bleeding at present, alert, NAD,

## 2014-07-25 NOTE — ED Provider Notes (Signed)
CSN: [id number]     Arrival date & time 07/25/14  1237 History  This chart was scribed for Glendell Docker, NP with Maudry Diego, MD by Edison Simon, ED Scribe. This patient was seen in room APFT20/APFT20 and the patient's care was started at 1:04 PM.    Chief Complaint  Patient presents with  . Epistaxis   The history is provided by the patient. No language interpreter was used.   HPI Comments: Jake Adams is a 73 y.o. male who is on Coumadin presents to the Emergency Department complaining of epistaxis from both nostrils with onset this morning. He reports coughing this morning prior to the beginning of his nosebleed. He reports having a one-sided nosebleed last Saturday that resolved with a saline nasal spray; he states he was not seen at a clinic at that time. He reports putting a piece of cardboard in his mouth that helped improve symptoms. He states he is scheduled to have his INR level checked in 2 weeks but was not able to reports his last measurement.   Past Medical History  Diagnosis Date  . Hypertension   . Dysrhythmia   . Pulmonary embolism 08/2010  . Osteoarthritis   . GERD (gastroesophageal reflux disease)   . PVC (premature ventricular contraction)   . ED (erectile dysfunction)   . Anxiety   . Hyperlipidemia   . Headache(784.0)     HX OF MIGRAINES  . Pain     SEVERE BACK PAIN -STATES KYPHOPLASTY DID NOT HELP HIS PAIN  . Fat embolus 1995    POST OP   Past Surgical History  Procedure Laterality Date  . Hernia repair    . Eye surgery      B cataracts  . Knee surgery  1995    TKR   . Fixation kyphoplasty lumbar spine  04/24/12  . Joint replacement      LEFT TOTAL HIP ARTHROPLASTY AND 6 REVISIONS-LAST WAS 2006;  RIGHT TOTAL KNEE ARTHROPLASTY -1995  . Total hip revision Left 03/18/2013    Procedure: LEFT TOTAL HIP REVISION;  Surgeon: Gearlean Alf, MD;  Location: [facility];  Service: Orthopedics;  Laterality: Left;   Family History  Problem Relation Age of Onset   . Breast cancer Mother    History  Substance Use Topics  . Smoking status: Former Research scientist (life sciences)  . Smokeless tobacco: Never Used     Comment: QUIT SMOKING AT AGE 25  . Alcohol Use: No    Review of Systems  HENT: Positive for nosebleeds.   Neurological: Negative for dizziness.      Allergies  Warfarin and related and Penicillins  Home Medications   Prior to Admission medications   Medication Sig Start Date End Date Taking? Authorizing Provider  atenolol (TENORMIN) 50 MG tablet Take 50 mg by mouth 2 (two) times daily.    Historical Provider, MD  LORazepam (ATIVAN) 1 MG tablet Take 1.5 mg by mouth at bedtime. For anxiety    Historical Provider, MD  methocarbamol (ROBAXIN) 500 MG tablet Take 1 tablet (500 mg total) by mouth every 6 (six) hours as needed. 03/19/13   Arlee Muslim, PA-C  pantoprazole (PROTONIX) 40 MG tablet Take 40 mg by mouth daily.    Historical Provider, MD  pravastatin (PRAVACHOL) 40 MG tablet Take 40 mg by mouth every evening.     Historical Provider, MD  traMADol (ULTRAM) 50 MG tablet Take 1-2 tablets (50-100 mg total) by mouth every 6 (six) hours as needed (mild pain).  03/19/13   Arlee Muslim, PA-C  warfarin (COUMADIN) 2 MG tablet Take 2 tablets (4 mg total) by mouth daily. Take 4mg  by mouth daily starting 5/18 evening. 03/20/13 03/20/14  Gearlean Alf, MD   BP 126/88  Pulse 69  Temp(Src) 98.2 F (36.8 C) (Oral)  Resp 18  Ht 5\' 10"  (1.778 m)  Wt 192 lb (87.091 kg)  BMI 27.55 kg/m2  SpO2 99% Physical Exam  Nursing note and vitals reviewed. Constitutional: He is oriented to person, place, and time. He appears well-developed and well-nourished.  HENT:  Head: Normocephalic and atraumatic.  No evidence of bleeding in nostrils  Eyes: Conjunctivae are normal.  Neck: Normal range of motion. Neck supple.  Pulmonary/Chest: Effort normal.  Musculoskeletal: Normal range of motion.  Neurological: He is alert and oriented to person, place, and time.  Skin: Skin is warm  and dry.  Psychiatric: He has a normal mood and affect.    ED Course  Procedures (including critical care time) Labs Review Labs Reviewed  PROTIME-INR - Abnormal; Notable for the following:    Prothrombin Time 26.3 (*)    INR 2.42 (*)    All other components within normal limits    Imaging Review No results found.   EKG Interpretation None     DIAGNOSTIC STUDIES: Oxygen Saturation is 99% on room air, normal by my interpretation.    COORDINATION OF CARE: 1:09 PM Discussed treatment plan with patient at beside, the patient agrees with the plan and has no further questions at this time.   MDM   Final diagnoses:  Epistaxis   Pt no longer bleeding after afrin. inr wnl. Discussed treatment at home with pt  I personally performed the services described in this documentation, which was scribed in my presence. The recorded information has been reviewed and is accurate.    Glendell Docker, NP 07/25/14 1359

## 2014-07-25 NOTE — Discharge Instructions (Signed)
Nosebleed °A nosebleed can be caused by many things, including: °· Getting hit hard in the nose. °· Infections. °· Dry nose. °· Colds. °· Medicines. °Your doctor may do lab testing if you get nosebleeds a lot and the cause is not known. °HOME CARE  °· If your nose was packed with material, keep it there until your doctor takes it out. Put the pack back in your nose if the pack falls out. °· Do not blow your nose for 12 hours after the nosebleed. °· Sit up and bend forward if your nose starts bleeding again. Pinch the front half of your nose nonstop for 20 minutes. °· Put petroleum jelly inside your nose every morning if you have a dry nose. °· Use a humidifier to make the air less dry. °· Do not take aspirin. °· Try not to strain, lift, or bend at the waist for many days after the nosebleed. °GET HELP RIGHT AWAY IF:  °· Nosebleeds keep happening and are hard to stop or control. °· You have bleeding or bruises that are not normal on other parts of the body. °· You have a fever. °· The nosebleeds get worse. °· You get lightheaded, feel faint, sweaty, or throw up (vomit) blood. °MAKE SURE YOU:  °· Understand these instructions. °· Will watch your condition. °· Will get help right away if you are not doing well or get worse. °Document Released: 07/30/2008 Document Revised: 01/13/2012 Document Reviewed: 07/30/2008 °ExitCare® Patient Information ©2015 [employer]. This information is not intended to replace advice given to you by your health care provider. Make sure you discuss any questions you have with your health care provider. ° °

## 2014-07-26 NOTE — ED Provider Notes (Signed)
Medical screening examination/treatment/procedure(s) were performed by non-physician practitioner and as supervising physician I was immediately available for consultation/collaboration.   EKG Interpretation None        Maudry Diego, MD 07/26/14 1252

## 2014-08-01 DIAGNOSIS — Z23 Encounter for immunization: Secondary | ICD-10-CM | POA: Diagnosis not present

## 2014-08-23 DIAGNOSIS — I2699 Other pulmonary embolism without acute cor pulmonale: Secondary | ICD-10-CM | POA: Diagnosis not present

## 2014-08-23 DIAGNOSIS — Z7901 Long term (current) use of anticoagulants: Secondary | ICD-10-CM | POA: Diagnosis not present

## 2014-10-05 DIAGNOSIS — Z7901 Long term (current) use of anticoagulants: Secondary | ICD-10-CM | POA: Diagnosis not present

## 2014-10-05 DIAGNOSIS — I2699 Other pulmonary embolism without acute cor pulmonale: Secondary | ICD-10-CM | POA: Diagnosis not present

## 2014-11-09 DIAGNOSIS — Z7901 Long term (current) use of anticoagulants: Secondary | ICD-10-CM | POA: Diagnosis not present

## 2014-11-09 DIAGNOSIS — I2699 Other pulmonary embolism without acute cor pulmonale: Secondary | ICD-10-CM | POA: Diagnosis not present

## 2014-12-26 DIAGNOSIS — Z7901 Long term (current) use of anticoagulants: Secondary | ICD-10-CM | POA: Diagnosis not present

## 2014-12-26 DIAGNOSIS — R8290 Unspecified abnormal findings in urine: Secondary | ICD-10-CM | POA: Diagnosis not present

## 2014-12-26 DIAGNOSIS — Z125 Encounter for screening for malignant neoplasm of prostate: Secondary | ICD-10-CM | POA: Diagnosis not present

## 2014-12-26 DIAGNOSIS — I10 Essential (primary) hypertension: Secondary | ICD-10-CM | POA: Diagnosis not present

## 2014-12-26 DIAGNOSIS — I2699 Other pulmonary embolism without acute cor pulmonale: Secondary | ICD-10-CM | POA: Diagnosis not present

## 2014-12-26 DIAGNOSIS — E785 Hyperlipidemia, unspecified: Secondary | ICD-10-CM | POA: Diagnosis not present

## 2014-12-26 DIAGNOSIS — R8299 Other abnormal findings in urine: Secondary | ICD-10-CM | POA: Diagnosis not present

## 2015-01-02 DIAGNOSIS — I10 Essential (primary) hypertension: Secondary | ICD-10-CM | POA: Diagnosis not present

## 2015-01-02 DIAGNOSIS — Z23 Encounter for immunization: Secondary | ICD-10-CM | POA: Diagnosis not present

## 2015-01-02 DIAGNOSIS — F419 Anxiety disorder, unspecified: Secondary | ICD-10-CM | POA: Diagnosis not present

## 2015-01-02 DIAGNOSIS — M1990 Unspecified osteoarthritis, unspecified site: Secondary | ICD-10-CM | POA: Diagnosis not present

## 2015-01-02 DIAGNOSIS — I2699 Other pulmonary embolism without acute cor pulmonale: Secondary | ICD-10-CM | POA: Diagnosis not present

## 2015-01-02 DIAGNOSIS — Z7901 Long term (current) use of anticoagulants: Secondary | ICD-10-CM | POA: Diagnosis not present

## 2015-01-02 DIAGNOSIS — Z0000 Encounter for general adult medical examination without abnormal findings: Secondary | ICD-10-CM | POA: Diagnosis not present

## 2015-01-02 DIAGNOSIS — E785 Hyperlipidemia, unspecified: Secondary | ICD-10-CM | POA: Diagnosis not present

## 2015-01-02 DIAGNOSIS — Z1389 Encounter for screening for other disorder: Secondary | ICD-10-CM | POA: Diagnosis not present

## 2015-01-02 DIAGNOSIS — Z6828 Body mass index (BMI) 28.0-28.9, adult: Secondary | ICD-10-CM | POA: Diagnosis not present

## 2015-01-02 DIAGNOSIS — Z125 Encounter for screening for malignant neoplasm of prostate: Secondary | ICD-10-CM | POA: Diagnosis not present

## 2015-01-24 DIAGNOSIS — I2699 Other pulmonary embolism without acute cor pulmonale: Secondary | ICD-10-CM | POA: Diagnosis not present

## 2015-01-24 DIAGNOSIS — Z7901 Long term (current) use of anticoagulants: Secondary | ICD-10-CM | POA: Diagnosis not present

## 2015-03-07 DIAGNOSIS — Z7901 Long term (current) use of anticoagulants: Secondary | ICD-10-CM | POA: Diagnosis not present

## 2015-03-07 DIAGNOSIS — I2699 Other pulmonary embolism without acute cor pulmonale: Secondary | ICD-10-CM | POA: Diagnosis not present

## 2015-04-17 DIAGNOSIS — M25552 Pain in left hip: Secondary | ICD-10-CM | POA: Diagnosis not present

## 2015-04-17 DIAGNOSIS — Z6828 Body mass index (BMI) 28.0-28.9, adult: Secondary | ICD-10-CM | POA: Diagnosis not present

## 2015-04-17 DIAGNOSIS — I2699 Other pulmonary embolism without acute cor pulmonale: Secondary | ICD-10-CM | POA: Diagnosis not present

## 2015-04-17 DIAGNOSIS — Z7901 Long term (current) use of anticoagulants: Secondary | ICD-10-CM | POA: Diagnosis not present

## 2015-05-30 DIAGNOSIS — I2699 Other pulmonary embolism without acute cor pulmonale: Secondary | ICD-10-CM | POA: Diagnosis not present

## 2015-05-30 DIAGNOSIS — Z7901 Long term (current) use of anticoagulants: Secondary | ICD-10-CM | POA: Diagnosis not present

## 2015-05-30 DIAGNOSIS — H6123 Impacted cerumen, bilateral: Secondary | ICD-10-CM | POA: Diagnosis not present

## 2015-05-30 DIAGNOSIS — H9193 Unspecified hearing loss, bilateral: Secondary | ICD-10-CM | POA: Diagnosis not present

## 2015-07-06 DIAGNOSIS — Z23 Encounter for immunization: Secondary | ICD-10-CM | POA: Diagnosis not present

## 2015-07-06 DIAGNOSIS — E785 Hyperlipidemia, unspecified: Secondary | ICD-10-CM | POA: Diagnosis not present

## 2015-07-06 DIAGNOSIS — I2699 Other pulmonary embolism without acute cor pulmonale: Secondary | ICD-10-CM | POA: Diagnosis not present

## 2015-07-06 DIAGNOSIS — N200 Calculus of kidney: Secondary | ICD-10-CM | POA: Diagnosis not present

## 2015-07-06 DIAGNOSIS — Z6827 Body mass index (BMI) 27.0-27.9, adult: Secondary | ICD-10-CM | POA: Diagnosis not present

## 2015-07-06 DIAGNOSIS — Z7901 Long term (current) use of anticoagulants: Secondary | ICD-10-CM | POA: Diagnosis not present

## 2015-07-06 DIAGNOSIS — I10 Essential (primary) hypertension: Secondary | ICD-10-CM | POA: Diagnosis not present

## 2015-07-06 DIAGNOSIS — K219 Gastro-esophageal reflux disease without esophagitis: Secondary | ICD-10-CM | POA: Diagnosis not present

## 2015-07-06 DIAGNOSIS — F419 Anxiety disorder, unspecified: Secondary | ICD-10-CM | POA: Diagnosis not present

## 2015-07-06 DIAGNOSIS — M1990 Unspecified osteoarthritis, unspecified site: Secondary | ICD-10-CM | POA: Diagnosis not present

## 2015-07-18 DIAGNOSIS — Z7901 Long term (current) use of anticoagulants: Secondary | ICD-10-CM | POA: Diagnosis not present

## 2015-07-18 DIAGNOSIS — R319 Hematuria, unspecified: Secondary | ICD-10-CM | POA: Diagnosis not present

## 2015-07-18 DIAGNOSIS — D6832 Hemorrhagic disorder due to extrinsic circulating anticoagulants: Secondary | ICD-10-CM | POA: Diagnosis not present

## 2015-07-18 DIAGNOSIS — K219 Gastro-esophageal reflux disease without esophagitis: Secondary | ICD-10-CM | POA: Diagnosis present

## 2015-07-18 DIAGNOSIS — R262 Difficulty in walking, not elsewhere classified: Secondary | ICD-10-CM | POA: Diagnosis not present

## 2015-07-18 DIAGNOSIS — D62 Acute posthemorrhagic anemia: Secondary | ICD-10-CM | POA: Diagnosis not present

## 2015-07-18 DIAGNOSIS — S301XXD Contusion of abdominal wall, subsequent encounter: Secondary | ICD-10-CM | POA: Diagnosis not present

## 2015-07-18 DIAGNOSIS — D649 Anemia, unspecified: Secondary | ICD-10-CM | POA: Diagnosis not present

## 2015-07-18 DIAGNOSIS — R809 Proteinuria, unspecified: Secondary | ICD-10-CM | POA: Diagnosis not present

## 2015-07-18 DIAGNOSIS — N200 Calculus of kidney: Secondary | ICD-10-CM | POA: Diagnosis present

## 2015-07-18 DIAGNOSIS — R7989 Other specified abnormal findings of blood chemistry: Secondary | ICD-10-CM | POA: Diagnosis not present

## 2015-07-18 DIAGNOSIS — Z87891 Personal history of nicotine dependence: Secondary | ICD-10-CM | POA: Diagnosis not present

## 2015-07-18 DIAGNOSIS — Y92199 Unspecified place in other specified residential institution as the place of occurrence of the external cause: Secondary | ICD-10-CM | POA: Diagnosis not present

## 2015-07-18 DIAGNOSIS — R42 Dizziness and giddiness: Secondary | ICD-10-CM | POA: Diagnosis not present

## 2015-07-18 DIAGNOSIS — T45515A Adverse effect of anticoagulants, initial encounter: Secondary | ICD-10-CM | POA: Diagnosis not present

## 2015-07-18 DIAGNOSIS — I959 Hypotension, unspecified: Secondary | ICD-10-CM | POA: Diagnosis not present

## 2015-07-18 DIAGNOSIS — X58XXXA Exposure to other specified factors, initial encounter: Secondary | ICD-10-CM | POA: Diagnosis not present

## 2015-07-18 DIAGNOSIS — R310 Gross hematuria: Secondary | ICD-10-CM | POA: Diagnosis not present

## 2015-07-18 DIAGNOSIS — F339 Major depressive disorder, recurrent, unspecified: Secondary | ICD-10-CM | POA: Diagnosis not present

## 2015-07-18 DIAGNOSIS — F418 Other specified anxiety disorders: Secondary | ICD-10-CM | POA: Diagnosis present

## 2015-07-18 DIAGNOSIS — M6281 Muscle weakness (generalized): Secondary | ICD-10-CM | POA: Diagnosis not present

## 2015-07-18 DIAGNOSIS — F419 Anxiety disorder, unspecified: Secondary | ICD-10-CM | POA: Diagnosis not present

## 2015-07-18 DIAGNOSIS — I10 Essential (primary) hypertension: Secondary | ICD-10-CM | POA: Diagnosis present

## 2015-07-18 DIAGNOSIS — I4891 Unspecified atrial fibrillation: Secondary | ICD-10-CM | POA: Diagnosis present

## 2015-07-18 DIAGNOSIS — D509 Iron deficiency anemia, unspecified: Secondary | ICD-10-CM | POA: Diagnosis not present

## 2015-07-18 DIAGNOSIS — M792 Neuralgia and neuritis, unspecified: Secondary | ICD-10-CM | POA: Diagnosis not present

## 2015-07-18 DIAGNOSIS — N201 Calculus of ureter: Secondary | ICD-10-CM | POA: Diagnosis present

## 2015-07-18 DIAGNOSIS — G629 Polyneuropathy, unspecified: Secondary | ICD-10-CM | POA: Diagnosis present

## 2015-07-18 DIAGNOSIS — E871 Hypo-osmolality and hyponatremia: Secondary | ICD-10-CM | POA: Diagnosis not present

## 2015-07-18 DIAGNOSIS — Z96651 Presence of right artificial knee joint: Secondary | ICD-10-CM | POA: Diagnosis present

## 2015-07-18 DIAGNOSIS — M7981 Nontraumatic hematoma of soft tissue: Secondary | ICD-10-CM | POA: Diagnosis present

## 2015-07-18 DIAGNOSIS — R230 Cyanosis: Secondary | ICD-10-CM | POA: Diagnosis not present

## 2015-07-18 DIAGNOSIS — Z96642 Presence of left artificial hip joint: Secondary | ICD-10-CM | POA: Diagnosis present

## 2015-07-18 DIAGNOSIS — R34 Anuria and oliguria: Secondary | ICD-10-CM | POA: Diagnosis not present

## 2015-07-18 DIAGNOSIS — E780 Pure hypercholesterolemia: Secondary | ICD-10-CM | POA: Diagnosis present

## 2015-07-18 DIAGNOSIS — Z86711 Personal history of pulmonary embolism: Secondary | ICD-10-CM | POA: Diagnosis not present

## 2015-07-18 DIAGNOSIS — N390 Urinary tract infection, site not specified: Secondary | ICD-10-CM | POA: Diagnosis not present

## 2015-07-18 DIAGNOSIS — R202 Paresthesia of skin: Secondary | ICD-10-CM | POA: Diagnosis not present

## 2015-07-18 DIAGNOSIS — E785 Hyperlipidemia, unspecified: Secondary | ICD-10-CM | POA: Diagnosis present

## 2015-07-18 DIAGNOSIS — R791 Abnormal coagulation profile: Secondary | ICD-10-CM | POA: Diagnosis not present

## 2015-07-18 DIAGNOSIS — D72829 Elevated white blood cell count, unspecified: Secondary | ICD-10-CM | POA: Diagnosis not present

## 2015-07-18 DIAGNOSIS — S3662XA Contusion of rectum, initial encounter: Secondary | ICD-10-CM | POA: Diagnosis not present

## 2015-07-18 DIAGNOSIS — N179 Acute kidney failure, unspecified: Secondary | ICD-10-CM | POA: Diagnosis present

## 2015-07-24 DIAGNOSIS — D62 Acute posthemorrhagic anemia: Secondary | ICD-10-CM | POA: Diagnosis not present

## 2015-07-24 DIAGNOSIS — I10 Essential (primary) hypertension: Secondary | ICD-10-CM | POA: Diagnosis not present

## 2015-07-24 DIAGNOSIS — R310 Gross hematuria: Secondary | ICD-10-CM | POA: Diagnosis not present

## 2015-07-24 DIAGNOSIS — I2699 Other pulmonary embolism without acute cor pulmonale: Secondary | ICD-10-CM | POA: Diagnosis not present

## 2015-07-24 DIAGNOSIS — M7981 Nontraumatic hematoma of soft tissue: Secondary | ICD-10-CM | POA: Diagnosis not present

## 2015-07-24 DIAGNOSIS — R319 Hematuria, unspecified: Secondary | ICD-10-CM | POA: Diagnosis not present

## 2015-07-24 DIAGNOSIS — R809 Proteinuria, unspecified: Secondary | ICD-10-CM | POA: Diagnosis not present

## 2015-07-24 DIAGNOSIS — N390 Urinary tract infection, site not specified: Secondary | ICD-10-CM | POA: Diagnosis not present

## 2015-07-24 DIAGNOSIS — Z96651 Presence of right artificial knee joint: Secondary | ICD-10-CM | POA: Diagnosis not present

## 2015-07-24 DIAGNOSIS — R202 Paresthesia of skin: Secondary | ICD-10-CM | POA: Diagnosis not present

## 2015-07-24 DIAGNOSIS — K219 Gastro-esophageal reflux disease without esophagitis: Secondary | ICD-10-CM | POA: Diagnosis not present

## 2015-07-24 DIAGNOSIS — R5381 Other malaise: Secondary | ICD-10-CM | POA: Diagnosis not present

## 2015-07-24 DIAGNOSIS — S301XXD Contusion of abdominal wall, subsequent encounter: Secondary | ICD-10-CM | POA: Diagnosis not present

## 2015-07-24 DIAGNOSIS — D72829 Elevated white blood cell count, unspecified: Secondary | ICD-10-CM | POA: Diagnosis not present

## 2015-07-24 DIAGNOSIS — E871 Hypo-osmolality and hyponatremia: Secondary | ICD-10-CM | POA: Diagnosis not present

## 2015-07-24 DIAGNOSIS — E785 Hyperlipidemia, unspecified: Secondary | ICD-10-CM | POA: Diagnosis not present

## 2015-07-24 DIAGNOSIS — F329 Major depressive disorder, single episode, unspecified: Secondary | ICD-10-CM | POA: Diagnosis not present

## 2015-07-24 DIAGNOSIS — D509 Iron deficiency anemia, unspecified: Secondary | ICD-10-CM | POA: Diagnosis not present

## 2015-07-24 DIAGNOSIS — M6281 Muscle weakness (generalized): Secondary | ICD-10-CM | POA: Diagnosis not present

## 2015-07-24 DIAGNOSIS — F339 Major depressive disorder, recurrent, unspecified: Secondary | ICD-10-CM | POA: Diagnosis not present

## 2015-07-24 DIAGNOSIS — R34 Anuria and oliguria: Secondary | ICD-10-CM | POA: Diagnosis not present

## 2015-07-24 DIAGNOSIS — Z96642 Presence of left artificial hip joint: Secondary | ICD-10-CM | POA: Diagnosis not present

## 2015-07-24 DIAGNOSIS — N179 Acute kidney failure, unspecified: Secondary | ICD-10-CM | POA: Diagnosis not present

## 2015-07-24 DIAGNOSIS — R791 Abnormal coagulation profile: Secondary | ICD-10-CM | POA: Diagnosis not present

## 2015-07-24 DIAGNOSIS — R262 Difficulty in walking, not elsewhere classified: Secondary | ICD-10-CM | POA: Diagnosis not present

## 2015-07-24 DIAGNOSIS — Z86711 Personal history of pulmonary embolism: Secondary | ICD-10-CM | POA: Diagnosis not present

## 2015-07-24 DIAGNOSIS — M792 Neuralgia and neuritis, unspecified: Secondary | ICD-10-CM | POA: Diagnosis not present

## 2015-07-24 DIAGNOSIS — F419 Anxiety disorder, unspecified: Secondary | ICD-10-CM | POA: Diagnosis not present

## 2015-07-25 DIAGNOSIS — R5381 Other malaise: Secondary | ICD-10-CM | POA: Diagnosis not present

## 2015-07-25 DIAGNOSIS — I2699 Other pulmonary embolism without acute cor pulmonale: Secondary | ICD-10-CM | POA: Diagnosis not present

## 2015-07-25 DIAGNOSIS — R319 Hematuria, unspecified: Secondary | ICD-10-CM | POA: Diagnosis not present

## 2015-07-25 DIAGNOSIS — F329 Major depressive disorder, single episode, unspecified: Secondary | ICD-10-CM | POA: Diagnosis not present

## 2015-07-31 DIAGNOSIS — F329 Major depressive disorder, single episode, unspecified: Secondary | ICD-10-CM | POA: Diagnosis not present

## 2015-07-31 DIAGNOSIS — I2699 Other pulmonary embolism without acute cor pulmonale: Secondary | ICD-10-CM | POA: Diagnosis not present

## 2015-07-31 DIAGNOSIS — R319 Hematuria, unspecified: Secondary | ICD-10-CM | POA: Diagnosis not present

## 2015-07-31 DIAGNOSIS — R5381 Other malaise: Secondary | ICD-10-CM | POA: Diagnosis not present

## 2015-08-07 DIAGNOSIS — I2699 Other pulmonary embolism without acute cor pulmonale: Secondary | ICD-10-CM | POA: Diagnosis not present

## 2015-08-07 DIAGNOSIS — M1990 Unspecified osteoarthritis, unspecified site: Secondary | ICD-10-CM | POA: Diagnosis not present

## 2015-08-07 DIAGNOSIS — I493 Ventricular premature depolarization: Secondary | ICD-10-CM | POA: Diagnosis not present

## 2015-08-07 DIAGNOSIS — I10 Essential (primary) hypertension: Secondary | ICD-10-CM | POA: Diagnosis not present

## 2015-08-07 DIAGNOSIS — Z7901 Long term (current) use of anticoagulants: Secondary | ICD-10-CM | POA: Diagnosis not present

## 2015-08-07 DIAGNOSIS — N200 Calculus of kidney: Secondary | ICD-10-CM | POA: Diagnosis not present

## 2015-08-07 DIAGNOSIS — Z6827 Body mass index (BMI) 27.0-27.9, adult: Secondary | ICD-10-CM | POA: Diagnosis not present

## 2015-08-07 DIAGNOSIS — E785 Hyperlipidemia, unspecified: Secondary | ICD-10-CM | POA: Diagnosis not present

## 2015-08-07 DIAGNOSIS — K219 Gastro-esophageal reflux disease without esophagitis: Secondary | ICD-10-CM | POA: Diagnosis not present

## 2015-09-07 DIAGNOSIS — K219 Gastro-esophageal reflux disease without esophagitis: Secondary | ICD-10-CM | POA: Diagnosis not present

## 2015-09-07 DIAGNOSIS — I10 Essential (primary) hypertension: Secondary | ICD-10-CM | POA: Diagnosis not present

## 2015-09-07 DIAGNOSIS — E785 Hyperlipidemia, unspecified: Secondary | ICD-10-CM | POA: Diagnosis not present

## 2015-09-07 DIAGNOSIS — F419 Anxiety disorder, unspecified: Secondary | ICD-10-CM | POA: Diagnosis not present

## 2015-09-07 DIAGNOSIS — M1990 Unspecified osteoarthritis, unspecified site: Secondary | ICD-10-CM | POA: Diagnosis not present

## 2015-09-07 DIAGNOSIS — I2699 Other pulmonary embolism without acute cor pulmonale: Secondary | ICD-10-CM | POA: Diagnosis not present

## 2015-09-07 DIAGNOSIS — Z7901 Long term (current) use of anticoagulants: Secondary | ICD-10-CM | POA: Diagnosis not present

## 2015-09-07 DIAGNOSIS — Z6827 Body mass index (BMI) 27.0-27.9, adult: Secondary | ICD-10-CM | POA: Diagnosis not present

## 2015-09-21 DIAGNOSIS — M1611 Unilateral primary osteoarthritis, right hip: Secondary | ICD-10-CM | POA: Diagnosis not present

## 2015-09-21 DIAGNOSIS — Z471 Aftercare following joint replacement surgery: Secondary | ICD-10-CM | POA: Diagnosis not present

## 2015-09-21 DIAGNOSIS — Z96642 Presence of left artificial hip joint: Secondary | ICD-10-CM | POA: Diagnosis not present

## 2015-11-14 DIAGNOSIS — T84031A Mechanical loosening of internal left hip prosthetic joint, initial encounter: Secondary | ICD-10-CM | POA: Diagnosis not present

## 2015-11-14 DIAGNOSIS — M25562 Pain in left knee: Secondary | ICD-10-CM | POA: Diagnosis not present

## 2015-12-05 DIAGNOSIS — G894 Chronic pain syndrome: Secondary | ICD-10-CM | POA: Diagnosis not present

## 2015-12-05 DIAGNOSIS — Z79899 Other long term (current) drug therapy: Secondary | ICD-10-CM | POA: Diagnosis not present

## 2015-12-14 DIAGNOSIS — M5417 Radiculopathy, lumbosacral region: Secondary | ICD-10-CM | POA: Diagnosis not present

## 2016-01-02 DIAGNOSIS — Z79899 Other long term (current) drug therapy: Secondary | ICD-10-CM | POA: Diagnosis not present

## 2016-01-02 DIAGNOSIS — M5136 Other intervertebral disc degeneration, lumbar region: Secondary | ICD-10-CM | POA: Diagnosis not present

## 2016-01-02 DIAGNOSIS — G894 Chronic pain syndrome: Secondary | ICD-10-CM | POA: Diagnosis not present

## 2016-01-02 DIAGNOSIS — M25559 Pain in unspecified hip: Secondary | ICD-10-CM | POA: Diagnosis not present

## 2016-01-02 DIAGNOSIS — M47816 Spondylosis without myelopathy or radiculopathy, lumbar region: Secondary | ICD-10-CM | POA: Diagnosis not present

## 2016-01-08 DIAGNOSIS — M545 Low back pain: Secondary | ICD-10-CM | POA: Diagnosis not present

## 2016-01-08 DIAGNOSIS — K219 Gastro-esophageal reflux disease without esophagitis: Secondary | ICD-10-CM | POA: Diagnosis not present

## 2016-01-08 DIAGNOSIS — I2699 Other pulmonary embolism without acute cor pulmonale: Secondary | ICD-10-CM | POA: Diagnosis not present

## 2016-01-08 DIAGNOSIS — Z7901 Long term (current) use of anticoagulants: Secondary | ICD-10-CM | POA: Diagnosis not present

## 2016-01-08 DIAGNOSIS — R600 Localized edema: Secondary | ICD-10-CM | POA: Diagnosis not present

## 2016-01-08 DIAGNOSIS — I10 Essential (primary) hypertension: Secondary | ICD-10-CM | POA: Diagnosis not present

## 2016-01-08 DIAGNOSIS — Z1389 Encounter for screening for other disorder: Secondary | ICD-10-CM | POA: Diagnosis not present

## 2016-01-08 DIAGNOSIS — E784 Other hyperlipidemia: Secondary | ICD-10-CM | POA: Diagnosis not present

## 2016-01-08 DIAGNOSIS — M25552 Pain in left hip: Secondary | ICD-10-CM | POA: Diagnosis not present

## 2016-01-08 DIAGNOSIS — E663 Overweight: Secondary | ICD-10-CM | POA: Diagnosis not present

## 2016-01-08 DIAGNOSIS — Z0000 Encounter for general adult medical examination without abnormal findings: Secondary | ICD-10-CM | POA: Diagnosis not present

## 2016-01-08 DIAGNOSIS — Z6826 Body mass index (BMI) 26.0-26.9, adult: Secondary | ICD-10-CM | POA: Diagnosis not present

## 2016-01-30 DIAGNOSIS — Z79899 Other long term (current) drug therapy: Secondary | ICD-10-CM | POA: Diagnosis not present

## 2016-01-30 DIAGNOSIS — G894 Chronic pain syndrome: Secondary | ICD-10-CM | POA: Diagnosis not present

## 2016-01-30 DIAGNOSIS — M5137 Other intervertebral disc degeneration, lumbosacral region: Secondary | ICD-10-CM | POA: Diagnosis not present

## 2016-02-07 DIAGNOSIS — Z7901 Long term (current) use of anticoagulants: Secondary | ICD-10-CM | POA: Diagnosis not present

## 2016-02-07 DIAGNOSIS — I2699 Other pulmonary embolism without acute cor pulmonale: Secondary | ICD-10-CM | POA: Diagnosis not present

## 2016-02-27 DIAGNOSIS — Z79891 Long term (current) use of opiate analgesic: Secondary | ICD-10-CM | POA: Diagnosis not present

## 2016-02-27 DIAGNOSIS — Z79899 Other long term (current) drug therapy: Secondary | ICD-10-CM | POA: Diagnosis not present

## 2016-02-27 DIAGNOSIS — G894 Chronic pain syndrome: Secondary | ICD-10-CM | POA: Diagnosis not present

## 2016-02-27 DIAGNOSIS — M5137 Other intervertebral disc degeneration, lumbosacral region: Secondary | ICD-10-CM | POA: Diagnosis not present

## 2016-03-20 DIAGNOSIS — I2699 Other pulmonary embolism without acute cor pulmonale: Secondary | ICD-10-CM | POA: Diagnosis not present

## 2016-03-20 DIAGNOSIS — Z7901 Long term (current) use of anticoagulants: Secondary | ICD-10-CM | POA: Diagnosis not present

## 2016-04-04 DIAGNOSIS — Z7901 Long term (current) use of anticoagulants: Secondary | ICD-10-CM | POA: Diagnosis not present

## 2016-04-04 DIAGNOSIS — I2699 Other pulmonary embolism without acute cor pulmonale: Secondary | ICD-10-CM | POA: Diagnosis not present

## 2016-04-10 DIAGNOSIS — M47817 Spondylosis without myelopathy or radiculopathy, lumbosacral region: Secondary | ICD-10-CM | POA: Diagnosis not present

## 2016-04-10 DIAGNOSIS — M5417 Radiculopathy, lumbosacral region: Secondary | ICD-10-CM | POA: Diagnosis not present

## 2016-04-10 DIAGNOSIS — Z79891 Long term (current) use of opiate analgesic: Secondary | ICD-10-CM | POA: Diagnosis not present

## 2016-04-10 DIAGNOSIS — Z79899 Other long term (current) drug therapy: Secondary | ICD-10-CM | POA: Diagnosis not present

## 2016-04-10 DIAGNOSIS — M545 Low back pain: Secondary | ICD-10-CM | POA: Diagnosis not present

## 2016-04-10 DIAGNOSIS — M5137 Other intervertebral disc degeneration, lumbosacral region: Secondary | ICD-10-CM | POA: Diagnosis not present

## 2016-04-10 DIAGNOSIS — G894 Chronic pain syndrome: Secondary | ICD-10-CM | POA: Diagnosis not present

## 2016-04-11 DIAGNOSIS — Z96642 Presence of left artificial hip joint: Secondary | ICD-10-CM | POA: Diagnosis not present

## 2016-04-11 DIAGNOSIS — Z471 Aftercare following joint replacement surgery: Secondary | ICD-10-CM | POA: Diagnosis not present

## 2016-05-08 DIAGNOSIS — I2699 Other pulmonary embolism without acute cor pulmonale: Secondary | ICD-10-CM | POA: Diagnosis not present

## 2016-05-08 DIAGNOSIS — Z7901 Long term (current) use of anticoagulants: Secondary | ICD-10-CM | POA: Diagnosis not present

## 2016-05-15 DIAGNOSIS — G894 Chronic pain syndrome: Secondary | ICD-10-CM | POA: Diagnosis not present

## 2016-05-15 DIAGNOSIS — Z79899 Other long term (current) drug therapy: Secondary | ICD-10-CM | POA: Diagnosis not present

## 2016-05-15 DIAGNOSIS — M5137 Other intervertebral disc degeneration, lumbosacral region: Secondary | ICD-10-CM | POA: Diagnosis not present

## 2016-05-15 DIAGNOSIS — Z79891 Long term (current) use of opiate analgesic: Secondary | ICD-10-CM | POA: Diagnosis not present

## 2016-05-20 DIAGNOSIS — Z96651 Presence of right artificial knee joint: Secondary | ICD-10-CM | POA: Diagnosis not present

## 2016-05-20 DIAGNOSIS — Z471 Aftercare following joint replacement surgery: Secondary | ICD-10-CM | POA: Diagnosis not present

## 2016-05-20 DIAGNOSIS — Z96642 Presence of left artificial hip joint: Secondary | ICD-10-CM | POA: Diagnosis not present

## 2016-05-24 ENCOUNTER — Other Ambulatory Visit: Payer: Self-pay

## 2016-05-24 DIAGNOSIS — I10 Essential (primary) hypertension: Secondary | ICD-10-CM

## 2016-05-24 NOTE — Patient Outreach (Addendum)
[facility]) Care Management  05/24/2016  Jake Adams 1941/01/13 [id number]    Telephone Screen  Referral Date: 05/23/16 Referral Source: EMMI engagement tool Referral Reason:  " HF, HTN"   Outreach attempt #1 to patient. Patient reached. Screening completed.  Social: Patient resides in his home alone. He states he is widowed as spouse passed away about two years ago. He has a son that lives in [place]. He reports that he is "managing" his ADLs/IADLs "the best" he can but admits he needs help. Patient states that he is fearful that someone will make him go to a nursing home and he desires to remain in his home. He reports that he has friends that check on him often. Patient reports he relies on friends and their schedules in order to make it to MD appts.He is requesting transportation assistance. He states he still has his license but "shape I'm in, I don't feel comfortable driving myself." Last fall reported almost a year ago. Patient states he has "trouble moving around." He is using a walker.   Conditions: HTN, dysrhythmia, left total hip replacement, PE, GERD, PVC, anxiety, HLD and HF. Patient is not monitoring BP in the home and doesn't know what his BP normally runs. He reports ongoing and chronic issues with BLE edema. However, when asked about HF diagnosis patient states he was unaware. He does not monitor weight in the home. He reports multiple issues with hips and legs "going bad." Patient states he has had multiple hip replacements. He has been told by ortho MD that he needs to undergo another artifical hip replacement.   Medications: Patient voices that he takes four meds in the morning and four meds in the evening. He denies any issues with affording meds at this time. He is managing his meds himself.  Consent:Patient gave verbal consent for [facility] services.    Plan: RN CM will notify [facility] administrative assistant of case status. RN CM will send [facility]  community referral for further in home eval and assessment of care needs. RN CM will send [facility] SW referral for transportation and in home support resources.  Enzo Montgomery, RN,BSN,CCM [place] [facility] Telephonic Care Management Coordinator Direct Phone: [phone] Toll Free: [phone] Fax: [phone]

## 2016-05-27 ENCOUNTER — Other Ambulatory Visit: Payer: Self-pay | Admitting: *Deleted

## 2016-05-27 ENCOUNTER — Encounter: Payer: Self-pay | Admitting: *Deleted

## 2016-05-27 NOTE — Patient Outreach (Signed)
Jake Adams  [place]  05/27/2016   Jake Adams 1941/05/21 [id number]   Jake Adams is a 75 year old gentleman with history of hypertension, dysrhythmia, left total hip replacement, pumonary embolism, gastroesophageal reflux disease, PVC, anxiety, hypercholesterolemia, and heart failure.   Jake Adams was referred to [place] [facility] by his primary care provider for assistance with care coordination, chronic disease Adams, and medication Adams. Jake Adams is not self monitoring (blod pressure or weight) and has knowledge deficits related to his chronic health conditions. He knows he is taking "4 pills in the morning and 4 pills at night" but has knowledge deficits related to his medications.   In addition, Jake Adams is admittedly having difficulty with ADL's, IADL's but wants to be able to stay in his home and live independently as long as possible. He reports last having had a fall a year ago and says he has trouble getting around because of hip and joint problems. He has been told he needs hip replacement surgery. As noted, he is widowed as of 2 years ago and lives in his home alone. His son lives in [place]. Jake Adams has been relying on friends to check in on him and assist with transportation to doctor visits.   Our telephonic case manager spoke with Jake Adams by phone last week and referred him to our community program. I reached out to Jake Adams by phone today to further discuss his care coordination needs.   . Subjective: "I sure could use all the help I can get."   Encounter Medications:  Outpatient Encounter Prescriptions as of 05/27/2016  Medication Sig  . atenolol (TENORMIN) 50 MG tablet Take 50 mg by mouth 2 (two) times daily.  . citalopram (CELEXA) 10 MG tablet Take 10 mg by mouth daily.  Marland Kitchen LORazepam (ATIVAN) 1 MG tablet Take 2 mg by mouth at bedtime. For anxiety  . methocarbamol (ROBAXIN) 500 MG tablet Take 1  tablet (500 mg total) by mouth every 6 (six) hours as needed.  . pantoprazole (PROTONIX) 40 MG tablet Take 40 mg by mouth daily.  . pravastatin (PRAVACHOL) 40 MG tablet Take 40 mg by mouth every evening.   . Saline (NASOGEL) GEL Place 1 spray into the nose daily.  . sucralfate (CARAFATE) 1 G tablet Take 1 g by mouth 4 (four) times daily -  with meals and at bedtime.  . traMADol (ULTRAM) 50 MG tablet Take 1-2 tablets (50-100 mg total) by mouth every 6 (six) hours as needed (mild pain).  Marland Kitchen warfarin (COUMADIN) 2 MG tablet Take 2 tablets (4 mg total) by mouth daily. Take 4mg  by mouth daily starting 5/18 evening.   Fall/Depression Screening: PHQ 2/9 Scores 05/24/2016  PHQ - 2 Score 0    Assessment: Jake Adams is a 75 year old gentleman living alone in [place]. He has increasing health care needs and is having difficulty managing his health because of a limited support system and transportation concerns. In addition, he has knowledge deficits related to his medications and chronic health problems.   Plan: Jake Adams agreed today to allow me to visit him at home so we can spend time reviewing his current situation and needs. I have scheduled a face to face appointment with him for next week. If I have cancellations this week, Jake Adams will allow me to see him then.   [name] CM Care Plan Problem One   Flowsheet Row Most Recent Value  Care Plan Problem One  Knowledge Deficits related to self health [name], HTN)  Role Documenting the Problem One  [name] Coordinator  Care Plan for Problem One  Active  [name]  Over the next 60 days, patient will verbalize understanding of need for long term self health Adams plan for CHF and HTN disease Adams  [name]  05/27/16  Interventions for Problem One Long Term Goal  Discussed with patient need for developement of long term plan for self health Adams  [name]  Over the next 14 days, patient will meet with RNCM in the home to develop written long term plan of care for self health Adams of chronic health conditions  [name]  05/27/16  [name]  Scheduled face to face visit with patient for development of self health Adams plan    [name]   Flowsheet Row Most Recent Value  Care Plan Problem Two  Over the next 31 days, patient will verbalize understanding of plan to decrease home safety/fall risk  Role Documenting the Problem Two  [name] Coordinator  Care Plan for Problem Two  Active  Interventions for Problem Two Long Term Goal   Discussed with patient importance of fall risk reduction strategy  [name]  Over the next 31 days, patient will develop fall risk reduction plan/strategy  [name]  05/27/16  THN CM Short Term Goal #1 (0-30 days)  Over the next 14 days, patient will meet with RNCM in the home to develop fall risk reduction strategy  [name]  05/27/16  Interventions for Short Term Goal #2   Scheduled face to face appointment to discuss fall risk reducation strategy    [name]   Flowsheet Row Most Recent Value  Care Plan Problem Three  Transportation concerns/needs  Role Documenting the Problem Three  [name]  Active  [name]  Over the next 31 days, patient will discuss transportation needs with LCSW  [facility] Long Term Goal Start Date  05/27/16      Jake Adams [place] Adams  [phone]

## 2016-05-29 ENCOUNTER — Encounter: Payer: Self-pay | Admitting: *Deleted

## 2016-05-29 ENCOUNTER — Other Ambulatory Visit: Payer: Self-pay | Admitting: *Deleted

## 2016-05-29 NOTE — Patient Outreach (Signed)
[facility]) Care Management   05/29/2016  Jake Adams 1941/04/28 [id number]  Jake Adams is an 75 y.o. gentleman with history of hypertension, dysrhythmia, left total hip replacement, pumonary embolism, gastroesophageal reflux disease, PVC, anxiety, hypercholesterolemia, and heart failure.   Jake Adams was referred to [facility] by his primary care provider for assistance with care coordination, chronic disease management, and medication management. Jake Adams is not self monitoring (blod pressure or weight) and has knowledge deficits related to his chronic health conditions. He knows he is taking "4 pills in the morning and 4 pills at night" but has knowledge deficits related to his medications.   In addition, Jake Adams is admittedly having difficulty with ADL's, IADL's but wants to be able to stay in his home and live independently as long as possible. He reports last having had a fall a year ago and says he has trouble getting around because of hip and joint problems. He has been told he needs hip replacement surgery. As noted, he is widowed as of 2 years ago and lives in his home alone. His son lives in [place]. Jake Adams has been relying on friends to check in on him.   I am visiting Jake Adams at his home today.   Subjective: "I'm glad you aren't here to tell me I have to go to a nursing home. I was afraid that was why you were coming."  Objective:  BP 110/78   Pulse 75   SpO2 95%   Review of Systems  Respiratory: Negative.  Negative for cough, sputum production, shortness of breath and wheezing.   Cardiovascular: Positive for leg swelling. Negative for chest pain and palpitations.       Mild swelling of right lower extremity from just above the ankle to the top of the foot; patient states this is chronic  Gastrointestinal: Negative.  Negative for heartburn, nausea and vomiting.  Genitourinary: Negative.   Musculoskeletal: Positive for back pain,  joint pain and myalgias. Negative for falls.       No recent falls; reports fall approximately 1 year ago  Neurological: Negative for dizziness, tremors, focal weakness and loss of consciousness.  Psychiatric/Behavioral: The patient is nervous/anxious.        Mildly anxious and per patient's admission "I'm a nervous kind of fella"    Physical Exam  Constitutional: He is oriented to person, place, and time. Vital signs are normal. He appears well-developed and well-nourished. He is active and cooperative. He does not have a sickly appearance. He does not appear ill. No distress.  Cardiovascular: Normal rate, regular rhythm and normal heart sounds.   Respiratory: Effort normal and breath sounds normal. No respiratory distress. He has no wheezes. He has no rales.  GI: Soft. Bowel sounds are normal. There is no tenderness.  Neurological: He is alert and oriented to person, place, and time.  Skin: Skin is warm, dry and intact.  Psychiatric: He has a normal mood and affect. His speech is normal and behavior is normal. Judgment and thought content normal. Cognition and memory are normal.    Encounter Medications:   Outpatient Encounter Prescriptions as of 05/29/2016  Medication Sig  . atenolol (TENORMIN) 50 MG tablet Take 50 mg by mouth 2 (two) times daily.  . citalopram (CELEXA) 10 MG tablet Take 10 mg by mouth daily.  Marland Kitchen LORazepam (ATIVAN) 1 MG tablet Take 2 mg by mouth at bedtime. For anxiety  . methocarbamol (ROBAXIN) 500 MG tablet Take  1 tablet (500 mg total) by mouth every 6 (six) hours as needed.  . pantoprazole (PROTONIX) 40 MG tablet Take 40 mg by mouth daily.  . pravastatin (PRAVACHOL) 40 MG tablet Take 40 mg by mouth every evening.   . Saline (NASOGEL) GEL Place 1 spray into the nose daily.  . sucralfate (CARAFATE) 1 G tablet Take 1 g by mouth 4 (four) times daily -  with meals and at bedtime.  . traMADol (ULTRAM) 50 MG tablet Take 1-2 tablets (50-100 mg total) by mouth every 6 (six)  hours as needed (mild pain).  Marland Kitchen warfarin (COUMADIN) 2 MG tablet Take 2 tablets (4 mg total) by mouth daily. Take 4mg  by mouth daily starting 5/18 evening.   Functional Status:   No flowsheet data found.  Fall/Depression Screening:    PHQ 2/9 Scores 05/24/2016  PHQ - 2 Score 0    Assessment:  75 year old gentleman living at home alone in [place], [place]. He has limited support and feels he is experiencing general decline in his overall health but particularly with mobility. He has knowledge deficits related to his chronic disease states and medications.    Chronic Health Condition (Hypertension) - Jake Adams has long standing hypertension; he has not been monitoring his bp at home; I will provide him with an electronic bp monitor on our next visit. I showed Jake Adams how to check his pulse today and discussed with him signs and symptoms related to uncontrolled hypertension and when he would need to call for help.   Chronic Pain related to orthopedic conditions and peripheral neuropathy - Jake Adams says he has had chronic pain in his left hip since he fell and sustained an injury at 75 years old. He has had multiple surgeries on his hip and back and says he is never completely pain free for a full 24 hour period. At this writing, he denies pain in his hip or back but complains of "almost constant" burning in his right leg and foot. He is taking Gabapentin 100mg  TID as prescribed and inquires as to whether a higher dose of this medication in particular might help his foot/leg burning. He says he doesn't want to take any more narcotic analgesic than absolutely necessary.   Chronic Health Condition (Dysrythmia on chronic anticoagulation) - Jake Adams takes Coumadin. We discussed his anticoagulation protocol, why he is taking an anticoagulant, and bleeding precautions.   Knowledge Deficits related to medication management - I reviewed all of Jake Adams prescribed medications with him in detail today. We  wrote the purpose of each of his medications on his pill bottles. He has prescriptions filled at [facility] in [place] when he needs a short term medication but is getting his long term medications filled through [facility] order [phone]). He was unsure about transitioning to mail order long term because 2 of his medications arrived a few days late when he began with the program. Since then, all medications have arrived on time. He says he will stick with the mail order program because medications are always delivered to him and he won't have to worry about going out to pick them up.    Home Safety/Fall Risk related to declining mobility and balance/gait concerns - Mr. Picasso relates that his back and hip pain prevents him from doing the things around his home he would like to be able to do. His gait and balance are poor. He says he is able to walk short distances only with his rolling  walker and cannot stand for any lengthy period of time. He is a very high fall risk. I provided print materials and discussed fall risk reduction strategies with him today.   Mr. Maceda says he wants to stay in his home for as long as he can. He doesn't want to even consider a higher level of care at this time. I discussed with him the options available including ALF, SNF, and in home care (private duty) when/if needed. He does not expect that his son would come home to provide care for him or would have him come to [place] to live if the need arose.   Transportation Needs -  Mr. Mousel has limited transportation reliability and typically uses friends for transportation to and from the doctor when he is unable to drive (after one of his many hip surgeries). Right now he is able to drive his own car and doesn't have trouble getting to doctor's appointments or other places he needs to go.  Our social work team is involved and will discuss transportation resources with Mr. Seeliger.   No Advanced Directives in Place - I  discussed advanced directives with Mr. Lafayette and provided Advanced Directives packets to him. He will review and discuss with his son and we will discuss further on our next home visit.   Plan:   I will update Mr. Winckler primary care provider of our visit today and of [name] complaint of ongoing leg/foot burning.   Mr. Nutall will call for any new/worsening symptoms or if he has any questions about his medications or treatment plan.   I will see Mr. Zara at home in 3 weeks for follow up.   [facility] CM Care Plan Problem One   Flowsheet Row Most Recent Value  Care Plan Problem One  Knowledge Deficits related to self health management (CHF, HTN)  Role Documenting the Problem One  Care Management Coordinator  Care Plan for Problem One  Active  [name]  Over the next 60 days, patient will verbalize understanding of need for long term self health management plan for HTN disease management  [name]  05/27/16  Interventions for Problem One Long Term Goal  Discussed with patient need for developement of long term plan for self health management  THN CM Short Term Goal #1 (0-30 days)  Over the next 14 days, patient will meet with RNCM in the home to develop written long term plan of care for self health management of chronic health conditions  [name]  05/27/16  [name]  Scheduled face to face visit with patient for development of self health management plan    [facility] CM Care Plan Problem Two   Flowsheet Row Most Recent Value  Care Plan Problem Two  Over the next 31 days, patient will verbalize understanding of plan to decrease home safety/fall risk  Role Documenting the Problem Two  Care Management Coordinator  Care Plan for Problem Two  Active  Interventions for Problem Two Long Term Goal   Discussed with patient importance of fall risk reduction strategy  [name]  Over  the next 31 days, patient will develop fall risk reduction plan/strategy  [name]  05/27/16  THN CM Short Term Goal #1 (0-30 days)  Over the next 14 days, patient will meet with RNCM in the home  to develop fall risk reduction strategy  [name]  05/27/16  Interventions for Short Term Goal #2   Scheduled face to face appointment to discuss fall risk reducation strategy    [facility] CM Care Plan Problem Three   Flowsheet Row Most Recent Value  Care Plan Problem Three  Transportation concerns/needs  Role Documenting the Problem Three  Care Management Coordinator  Care Plan for Problem Three  Active  [name]  Over the next 31 days, patient will discuss transportation needs with LCSW  [facility] Long Term Goal Start Date  05/27/16      Janalyn Shy [facility] Care Management  [phone]

## 2016-05-30 ENCOUNTER — Other Ambulatory Visit: Payer: Self-pay | Admitting: Licensed Clinical Social Worker

## 2016-05-30 ENCOUNTER — Encounter: Payer: Self-pay | Admitting: Licensed Clinical Social Worker

## 2016-05-30 NOTE — Patient Outreach (Signed)
Assessment:  CSW received referral on Jake Adams.  CSW reviewed chart for client on 05/30/16. Client said he sees Dr. Reynaldo Minium as his primary care doctor in [place], [place].   He is trying to attend scheduled medical appointments.  Client said he goes to [facility] for care of his bones.  Client uses a walker to ambulate. He said he can drive if needed; but it is more difficult to drive at present. He has a current driver's license.  He said when he drives it is difficult for him to get in and out of his vehicle.  CSW spoke with client about [facility] Service support. Client said he feels that he may need more transport support to go to and from his scheduled medical appointments. Client said he has a friend who sometimes helps him with transport to and from medical appointments.  Client said he does want to remain at his home.  Client said he has Medicare as well as AARP (supplement).  Client said he has his prescribed medications and is taking medications as prescribed. Client said he has had support in the past with transportation assistance with friends.  Client said his wife died 2 years ago.Client spoke of grief issues but said he is still adjusting to death of his wife 2 years ago. Client is taking a prescribed medication for anxiety.  CSW spoke with client about [facility] program services. CSW and client reviewed [facility] assessments for client  Client said he has a brother in [place], [place]. Client said he uses recliner at home to sit comfortably. CSW and client spoke of client care plan. CSW encouraged client to communicate with CSW in next 30 days to discuss transportation resources for client in the area.   CSW thanked client for phone call with CSW   CSW gave client CSW phone number of [phone] and encouraged client to call CSW to speak about social work needs of client. CSW encouraged Jake Adams to call RN Janalyn Shy as needed to address nursing needs of client.    Plan:  Client to  communicate with CSW in next 30 days to discuss transportation resources for client in the community. CSW to collaborate with RN Janalyn Shy in monitoring needs of client. CSW to call client in two weeks to assess client needs at that time.  Norva Riffle.Allisa Einspahr MSW, LCSW Licensed Clinical Social Worker [facility] Care Management [phone]

## 2016-05-31 ENCOUNTER — Encounter: Payer: Self-pay | Admitting: *Deleted

## 2016-06-05 DIAGNOSIS — I2699 Other pulmonary embolism without acute cor pulmonale: Secondary | ICD-10-CM | POA: Diagnosis not present

## 2016-06-05 DIAGNOSIS — Z7901 Long term (current) use of anticoagulants: Secondary | ICD-10-CM | POA: Diagnosis not present

## 2016-06-07 ENCOUNTER — Other Ambulatory Visit: Payer: Self-pay | Admitting: *Deleted

## 2016-06-07 ENCOUNTER — Encounter: Payer: Self-pay | Admitting: *Deleted

## 2016-06-07 DIAGNOSIS — R269 Unspecified abnormalities of gait and mobility: Secondary | ICD-10-CM

## 2016-06-07 DIAGNOSIS — R413 Other amnesia: Secondary | ICD-10-CM | POA: Insufficient documentation

## 2016-06-07 NOTE — Patient Outreach (Signed)
[facility]) Care Management  06/07/2016  DHYEY MCGEE 06/20/1941 [id number]   Mr Ghent called the 24 hour nurse line 3 times to report that he had swelling in his ankle and needed help. I reached out to Mr. Cookson by phone and he stated that his feet/legs/ankles are no more swollen than usual and that really only the right ankle was swollen and he felt it was because he'd been up on it the day before. He denies any other symptoms (new or worsened) and asked when I would be back out to see him again. I reviewed with him our upcoming home visit scheduled for 8/17 at 10:30am and reminded him that he could call me directly during regular business hours or could call our 24/7 nurse line should he have questions or new or worsened symptoms.   Plan: I will see Mr. Feinstein at home for a routine visit on 06/20/16 @ 10:30.    [facility]  [phone]

## 2016-06-13 ENCOUNTER — Other Ambulatory Visit: Payer: Self-pay | Admitting: Licensed Clinical Social Worker

## 2016-06-13 NOTE — Patient Outreach (Signed)
Assessment:  CSW spoke via phone with client on 06/13/16. CSW verified client identity. CSW and client spoke of client  needs.  Client receives [facility] support with RN Janalyn Shy. Client has reported challenges related to transportation issues to go to and from his scheduled medical appointments.  CSW informed client that [facility] is a resource client may be able to use for his transport needs to and from medical appointments. CSW gave client the phone number of [facility] Service: [phone].  Client wrote down phone number for [place]. Client sees Dr. Reynaldo Minium, as primary care doctor in [place]. Client uses a walker to ambulate. Client said he can drive but it is difficult for him to drive at present. He said it is difficult for him to get in and out of vehicle. He said he has a friend who occasionally helps to transport him to and from client's scheduled medical appointments. Client said he does want to remain at his home if he is able to do so. Client has Medicare. 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 Spouse of client died 2 years ago. Client said he is still adjusting to the death of his wife 2 years ago. CSW and client spoke of client care plan. CSW encouraged client to communicate with CSW in next 30 days to discuss transportation resources for client in the area. Client said he has had 8 surgeries on his left hip.  Client said he had appointment yesterday in [place], [place] at [facility] to discuss his hip pain and hip needs.  Client has given written consent for his medical record to go to [facility] for doctors to review.  Client said that [facility] had been involved with his hip repairs previously on 3 occasions.  Client has his prescribed medications and is taking medications as prescribed.  Client said he had not fallen recently.  Client has a son who lives in [place], [place]. Client said he fixes  sandwiches to eat or eats light meals. Client said he also receives [employer] meal delivery for lunches 5 days per week to his home. Client wears glasses to improve vision.   Client said he takes sponge baths regularly. CSW thanked client for phone call with CSW. CSW encouraged client to call CSW at [phone] as needed to discuss social work needs of client.    Plan:   Client to communicate with CSW in next 30 days to discuss transportation resources for client in the area. CSW to collaborate with RN Janalyn Shy in monitoring needs of client. CSW to call client in 4 weeks to assess client needs.  Norva Riffle.Audris Speaker MSW, LCSW Licensed Clinical Social Worker [facility] Care Management [phone]

## 2016-06-18 DIAGNOSIS — M5137 Other intervertebral disc degeneration, lumbosacral region: Secondary | ICD-10-CM | POA: Diagnosis not present

## 2016-06-18 DIAGNOSIS — M25559 Pain in unspecified hip: Secondary | ICD-10-CM | POA: Diagnosis not present

## 2016-06-18 DIAGNOSIS — M47817 Spondylosis without myelopathy or radiculopathy, lumbosacral region: Secondary | ICD-10-CM | POA: Diagnosis not present

## 2016-06-18 DIAGNOSIS — G894 Chronic pain syndrome: Secondary | ICD-10-CM | POA: Diagnosis not present

## 2016-06-18 DIAGNOSIS — Z79891 Long term (current) use of opiate analgesic: Secondary | ICD-10-CM | POA: Diagnosis not present

## 2016-06-18 DIAGNOSIS — Z79899 Other long term (current) drug therapy: Secondary | ICD-10-CM | POA: Diagnosis not present

## 2016-06-20 ENCOUNTER — Encounter: Payer: Self-pay | Admitting: *Deleted

## 2016-06-20 ENCOUNTER — Other Ambulatory Visit: Payer: Self-pay | Admitting: *Deleted

## 2016-06-20 NOTE — Patient Outreach (Signed)
[facility]) Care Management   06/20/2016  Jake Adams [birth year] [id number]  Jake Adams is an 75 y.o. gentleman with history of hypertension,dysrhythmia, left total hip replacement, pumonary embolism, gastroesophageal reflux disease, PVC, anxiety, hypercholesterolemia, and heart failure.   Jake Adams was referred to [facility] by his primary care provider for assistance with care coordination, chronic disease management, and medication management. Jake Adams is not self monitoring (blod pressure or weight) and has knowledge deficits related to his chronic health conditions. He knows he is taking "4 pills in the morning and 4 pills at night" but has knowledge deficits related to his medications.   In addition, Jake Adams is admittedly having difficulty with ADL's, IADL's but wants to be able to stay in his home and live independently as long as possible. He reports last having had a fall a year ago and says he has trouble getting around because of hip and joint problems. He has been told he needs hip replacement surgery. As noted, he is widowed as of 2 years ago and lives in his home alone. His son lives in [place]. Jake Adams has been relying on friends to check in on him.   I am visiting Jake Adams at his home today.   Subjective:   Objective:   Review of Systems  Constitutional: Negative for fever and weight loss.  HENT: Negative.   Eyes: Negative.   Respiratory: Negative for cough and shortness of breath.   Cardiovascular: Positive for leg swelling.       Chronic mild right lower extremity swelling from right knee (post TKR) to ankle  Gastrointestinal: Negative.   Genitourinary: Negative.   Musculoskeletal: Positive for joint pain and myalgias.  Skin:       Skin mildly irritated and slightly red at old post surgical incision site left hip; skin not broken  Neurological: Negative for dizziness, sensory change, focal weakness and loss of consciousness.   Psychiatric/Behavioral: The patient is nervous/anxious.        Pleasant, chronically anxious    Physical Exam  Constitutional: He is oriented to person, place, and time. He appears well-developed and well-nourished. He is active. He does not have a sickly appearance. He does not appear ill.  Cardiovascular: Normal rate, regular rhythm, S1 normal and normal heart sounds.   Respiratory: Effort normal. No respiratory distress. He has decreased breath sounds. He has no wheezes. He has no rhonchi. He has no rales.  GI: Soft. Bowel sounds are normal.  Musculoskeletal: He exhibits edema.  Chronic soft tissue swelling/mild edema right lower extremity  Neurological: He is alert and oriented to person, place, and time.  Skin: Skin is warm and dry.     Psychiatric: He has a normal mood and affect. His speech is normal and behavior is normal. Judgment and thought content normal. Cognition and memory are normal.  Pleasant, mildly anxious, frequently apologizes during conversation for things like calling for questions or asking "too many questions" during conversation    Encounter Medications:   Outpatient Encounter Prescriptions as of 06/20/2016  Medication Sig  . atenolol (TENORMIN) 50 MG tablet Take 50 mg by mouth 2 (two) times daily.  . citalopram (CELEXA) 10 MG tablet Take 10 mg by mouth daily.  Marland Kitchen HYDROcodone-acetaminophen (NORCO/VICODIN) 5-325 MG tablet Take 1 tablet by mouth every 4 (four) hours as needed for moderate pain.  Marland Kitchen LORazepam (ATIVAN) 1 MG tablet Take 2 mg by mouth at bedtime. For anxiety  . methocarbamol (ROBAXIN)  500 MG tablet Take 1 tablet (500 mg total) by mouth every 6 (six) hours as needed. (Patient not taking: Reported on 05/29/2016)  . pantoprazole (PROTONIX) 40 MG tablet Take 40 mg by mouth daily.  . pravastatin (PRAVACHOL) 40 MG tablet Take 40 mg by mouth every evening.   . Saline (NASOGEL) GEL Place 1 spray into the nose daily.  . sucralfate (CARAFATE) 1 G tablet Take 1 g  by mouth 4 (four) times daily -  with meals and at bedtime.  . traMADol (ULTRAM) 50 MG tablet Take 1-2 tablets (50-100 mg total) by mouth every 6 (six) hours as needed (mild pain). (Patient not taking: Reported on 05/29/2016)  . warfarin (COUMADIN) 2 MG tablet Take 2 tablets (4 mg total) by mouth daily. Take 4mg  by mouth daily starting 5/18 evening.   No facility-administered encounter medications on file as of 06/20/2016.     Functional Status:   In your present state of health, do you have any difficulty performing the following activities: 05/30/2016 05/30/2016  Hearing? N N  Vision? N N  Difficulty concentrating or making decisions? Tempie Donning  Walking or climbing stairs? Y Y  Dressing or bathing? Y Y  Doing errands, shopping? Tempie Donning  Preparing Food and eating ? Y N  Using the Toilet? N N  In the past six months, have you accidently leaked urine? N N  Do you have problems with loss of bowel control? N N  Managing your Medications? Y N  Managing your Finances? N N  Housekeeping or managing your Housekeeping? Y N  Some recent data might be hidden    Fall/Depression Screening:    PHQ 2/9 Scores 05/30/2016 05/30/2016 05/24/2016  PHQ - 2 Score 2 0 0  PHQ- 9 Score 8 - -    Assessment:  75 year old gentleman living at home alone in [place], [place]. He has limited support and feels he is experiencing general decline in his overall health but particularly with mobility. He has knowledge deficits related to his chronic disease states and medications and has DME needs.   Chronic Pain related to orthopedic conditions and peripheral neuropathy - Jake Adams says he has had chronic pain in his left hip since he fell and sustained an injury at 75 years old. He has had multiple surgeries on his hip and back and says he is never completely pain free for a full 24 hour period. Jake Adams has chronic burning in his right leg and foot. He is taking Gabapentin 100mg  TID as prescribed and inquired as to whether a higher  dose of this medication in particular might help his foot/leg burning. He says he doesn't want to take any more narcotic analgesic than absolutely necessary. I forwarded this information and request to Dr. Reynaldo Minium.   Acute Health Condition (redness/tenderness skin left hip at old surgical site) - Jake Adams asked me to check the skin on his left hip today; he says it has been tender and he is worried about the condition of his skin; he does have some tenderness/redness along an old surgical incision line where the skin and soft tissue is somewhat puckered but the skin is intact. We discussed pressure relief strategies and I advised him to always have it checked whenever he sees a doctor or nurse.    Home Safety/Fall Risk related to declining mobility and balance/gait concerns - Jake Adams is very high risk for fall (see previous note and fall risk assessment). He has NOT had any  falls since our last visit. Jake Adams ambulates in the home with a rolling walker and has a raised seat in the kitchen where he sits for visits. He sleeps mostly on his sofa because of ease of entrance to the room with his walker.   Jake Adams has been bathing from the bathroom sink because the last time he got in the bathtub he wasn't able to get out unassisted. Fortunately, his friend agreed to stay in the house while he took a bath and the gentleman was able to help him get out of the tub.   I examined Jake Adams bathroom and believe he would benefit from a tub bench and raised toilet seat. The bench can be used in conjunction with his hand held shower head. He needs a soft cushion on his toilet seat because of hip pain when sitting on a hard surface. I asked Dr. Reynaldo Minium to consider ordering these pieces of equipment today.   Level of Care Concerns - Jake Adams says he wants to stay in his home for as long as he can. He doesn't want to consider a higher level of care at this time but today said he realized he may have to "go  somewhere" at some point in the future. I discussed with him the options available including ALF, SNF, and in home care (private duty) when/if needed. He does not expect that his son would come home to provide care for him or would have him come to [place] to live if the need arose.   Support System:  As I have gotten to know Jake Adams and visited him a few times, I've come to understand that he has quite an impressive support group. There are 3 gentlemen in particular who visit regularly, take Mr. Dahlen out, or call him. 2 ladies from his church were visiting today when I arrived and had provided a few meals. To my best understanding, Mr. Salz is leaving his home every Monday and Wednesday to ride along on a Meals on Wheels delivery route with a friend. He is receiving visitors 2-3 days/weekly and receives multiple daily phone calls from friends.   Transportation Needs -  Mr. Zales is using a community transportation service for appointments and is grateful for this service.    No Advanced Directives in Place - I discussed advanced directives with Mr. Mcfarren and provided Advanced Directives packets to him. He has not reviewed with his son and and says he doesn't know when he will see or talk with him next.    Plan:   I will update Mr. Gell primary care provider of our visit today and of Mr. Avellino complaint of ongoing leg/foot burning and request for consideration of Gabapentin dose adjustment or other recommendation.  I also notified Dr. Reynaldo Minium of Mr. Rusin DME needs.    Mr. Jerabek will call for any new/worsening symptoms or if he has any questions about his medications or treatment plan.   I will reach out to Mr. Mcgaffey by phone next week to follow up on DME requests.    [facility]  [phone]    I will see Mr. Beltre at home in 3 weeks for follow up.

## 2016-06-24 ENCOUNTER — Other Ambulatory Visit: Payer: Self-pay | Admitting: *Deleted

## 2016-06-24 NOTE — Patient Outreach (Addendum)
[facility]) Care Management  06/24/2016  ELICIO GORGA 07-31-1941 [id number]   I left a message with Lattie Haw at [facility] requesting the following information be provided to Dr. Reynaldo Minium:   1) DME - Mr. Tischner would benefit from a tub bench and raised toilet seat. I requested that faxed orders include a notation to have the DME provider call me at [phone] to discuss pricing and out of pocket cost for Mr. Alltop in addition to details regarding his needs as it relates to the raised toilet seat.   2) Appointment scheduling - Mr. Pitcock requests that upcoming appointments for INR check with the nurse and office visit with Dr. Reynaldo Minium be consolidated or at least on the same day as transportation and mobility issues are barriers for Mr. Grenier.   Plan: I will follow up with Mr. Dipace and Dr. Jacquiline Doe office by phone.    [facility]  [phone]    Addendum: I followed up with Mr. Prout re: requested contact information for ortho provider.

## 2016-06-27 ENCOUNTER — Other Ambulatory Visit: Payer: Self-pay | Admitting: *Deleted

## 2016-06-27 NOTE — Patient Outreach (Signed)
[facility]) Care Management  06/27/2016  CHAMBERLAIN MARKSON May 11, 1941 [id number]   I reached out to Danae Chen, Assistant to Dr. Burnard Bunting at [facility] @ [phone] to follow up on Mr. Ewert requests:   1) Consolidation of upcoming appointment for INR check with appointment with Dr. Reynaldo Minium to help alleviate barriers related to Mr. Dischler mobility and transportation limitations.  2) DME needs - I requested a return call regarding orders for a tub bench and elevated toilet seat.   Plan: I will follow up with Mr. Leer by phone. LCSW Scott Forrest updated.    [facility]  [phone]

## 2016-07-11 ENCOUNTER — Other Ambulatory Visit: Payer: Self-pay | Admitting: *Deleted

## 2016-07-11 NOTE — Patient Outreach (Signed)
[place] [place] [facility]) Care Management  07/11/2016  Jake Adams January 24, 1941 [id number]  Call received from Mr. Roher today who asked if I would be able to see him at home next week to review his medications with him again. He is still receiving mail order medications and finds it confusing when the delivered medications change color or size, even when the medication is labeled properly. I spent a great deal of time reviewing medications with him by phone today and will review again on my visit next week.   Plan: Face to Face/Home Visit Tuesday.    [facility]  [phone]

## 2016-07-16 ENCOUNTER — Other Ambulatory Visit: Payer: Self-pay | Admitting: *Deleted

## 2016-07-16 ENCOUNTER — Encounter: Payer: Self-pay | Admitting: *Deleted

## 2016-07-16 NOTE — Patient Outreach (Signed)
[facility]) Care Management   07/16/2016  Jake Adams 10/09/1941 [id number]  Jake Adams is an 75 y.o.  gentleman with history of hypertension,dysrhythmia, left total hip replacement, pumonary embolism, gastroesophageal reflux disease, PVC, anxiety, hypercholesterolemia, and heart failure.   Jake Adams was referred to [facility] by his primary care provider for assistance with care coordination, chronic disease management, and medication management. In addition, Jake Adams is admittedly having difficulty with ADL's, IADL's but wants to be able to stay in his home and live independently as long as possible. He reports last having had a fall a year ago and says he has trouble getting around because of hip and joint problems. He has been told he needs hip replacement surgery. As noted, he is widowed as of 2 years ago and lives in his home alone. His son lives in [place]. Jake Adams has been relying on friends to check in on him.   We have been working on medication management and fall risk reduction including installation of DME. I am seeing Jake Adams today at his request to review medications again and help him with DME questions.   Subjective: "I guess I'm doing okay. I just wish I had some more help around here"  Objective: BP 114/70   Pulse 76   SpO2 98%    Review of Systems  Constitutional: Negative.   HENT: Negative.   Eyes: Negative.   Respiratory: Negative.   Cardiovascular: Negative.   Gastrointestinal: Negative.   Genitourinary: Negative.   Musculoskeletal: Positive for myalgias. Negative for falls.  Skin: Negative.     Physical Exam  Vitals reviewed. Constitutional: He is oriented to person, place, and time. Vital signs are normal. He appears well-developed. He is active.  Non-toxic appearance. He does not have a sickly appearance. He does not appear ill.  Cardiovascular: Normal rate, regular rhythm and normal heart sounds.   Respiratory:  Effort normal. He has no wheezes. He has no rhonchi. He has no rales.  GI: Soft. Bowel sounds are normal. There is no tenderness.  Musculoskeletal:       Right hip: He exhibits decreased range of motion.       Left hip: He exhibits decreased range of motion.  Neurological: He is alert and oriented to person, place, and time.  Skin: Skin is warm, dry and intact.  Psychiatric: He has a normal mood and affect. His speech is normal and behavior is normal. Judgment and thought content normal. Cognition and memory are normal.    Encounter Medications:   Outpatient Encounter Prescriptions as of 07/16/2016  Medication Sig  . atenolol (TENORMIN) 50 MG tablet Take 50 mg by mouth 2 (two) times daily.  . citalopram (CELEXA) 10 MG tablet Take 10 mg by mouth daily.  Marland Kitchen HYDROcodone-acetaminophen (NORCO/VICODIN) 5-325 MG tablet Take 1 tablet by mouth every 4 (four) hours as needed for moderate pain.  Marland Kitchen LORazepam (ATIVAN) 1 MG tablet Take 2 mg by mouth at bedtime. For anxiety  . methocarbamol (ROBAXIN) 500 MG tablet Take 1 tablet (500 mg total) by mouth every 6 (six) hours as needed. (Patient not taking: Reported on 05/29/2016)  . pantoprazole (PROTONIX) 40 MG tablet Take 40 mg by mouth daily.  . pravastatin (PRAVACHOL) 40 MG tablet Take 40 mg by mouth every evening.   . Saline (NASOGEL) GEL Place 1 spray into the nose daily.  . sucralfate (CARAFATE) 1 G tablet Take 1 g by mouth 4 (four) times daily -  with meals and at bedtime.  . traMADol (ULTRAM) 50 MG tablet Take 1-2 tablets (50-100 mg total) by mouth every 6 (six) hours as needed (mild pain). (Patient not taking: Reported on 05/29/2016)  . warfarin (COUMADIN) 2 MG tablet Take 2 tablets (4 mg total) by mouth daily. Take 4mg  by mouth daily starting 5/18 evening.   Assessment:  74 year old gentleman living at home alone in [place], [place]. He has limited support and feels he is experiencing general decline in his overall health but particularly with mobility.  He has knowledge deficits related to his chronic disease states and medications and has DME needs.   Home Safety/Fall Risk related to declining mobility and balance/gait concerns- Jake Adams is very high risk for fall;; he has NOT had any falls since our last visit. Jake Adams ambulates in the home with a rolling walker and has a raised seat in the kitchen where he sits for visits. He sleeps mostly on his sofa because of ease of entrance to the room with his walker.   Jake Adams has received requested DME (3N1). He also has a hand held shower head. Unfortunately, the 3n1 does not fit in his tub. I am checking on prices for tub benches as they are an out of pocket expense not covered by Medicare and will report those finding to Jake Adams.   Level of Care Concerns - Jake Adams says he wants to stay in his home for as long as he can. He doesn't want to consider a higher level of care at this time but today said he realized he may have to "go somewhere" at some point in the future. Again today,  I discussed with him the options available including ALF, SNF, and in home care (private duty) when/if needed.   I encouraged Jake Adams to consider paying for private duty in home care providers if only for a few hours, 2-3 days/week. He agreed to a consult with [facility] to let them visit him and give him information about services they can provide.    Support System:  As I have gotten to know Jake Adams and visited him a few times, I've come to understand that he has quite an impressive support group. While he does not seem to have a working relationship with his son who lives in [place], [place], there are 3 gentlemen in particular who visit regularly, take Jake Adams out, or call him. :Ladies from his church visit and provide meals intermittently. To my best understanding, Mr. Nicholes is leaving his home every Monday and Wednesday to ride along on a Meals on Wheels delivery route with a friend. He is receiving visitors 2-3  days/weekly and receives multiple daily phone calls from friends.   Transportation Needs - Mr. Amore is using a community transportation service for appointments and is grateful for this service.    No Advanced Directives in Place- I discussed advanced directives with Mr. Jupiter and provided Advanced Directives packets to him. He has not reviewed with his son and and says he doesn't know when he will see or talk with him next. We reviewed the importance of establishment of Advanced Directives again today.   Plan:   I will update Mr. Goodlow primary care provider of our visit today.   Mr. Sydow will attend his appointment with Dr. Reynaldo Minium tomorrow.     Mr. Panno will call for any new/worsening symptoms or if he has any questions about his medications or treatment plan.  I will reach out to Mr. Boulden by phone next week to follow up on DME requests   [facility]  [phone]

## 2016-07-17 DIAGNOSIS — M4800 Spinal stenosis, site unspecified: Secondary | ICD-10-CM | POA: Diagnosis not present

## 2016-07-17 DIAGNOSIS — Z23 Encounter for immunization: Secondary | ICD-10-CM | POA: Diagnosis not present

## 2016-07-17 DIAGNOSIS — Z7901 Long term (current) use of anticoagulants: Secondary | ICD-10-CM | POA: Diagnosis not present

## 2016-07-17 DIAGNOSIS — I2699 Other pulmonary embolism without acute cor pulmonale: Secondary | ICD-10-CM | POA: Diagnosis not present

## 2016-07-17 DIAGNOSIS — R531 Weakness: Secondary | ICD-10-CM | POA: Diagnosis not present

## 2016-07-17 DIAGNOSIS — M545 Low back pain: Secondary | ICD-10-CM | POA: Diagnosis not present

## 2016-07-17 DIAGNOSIS — E784 Other hyperlipidemia: Secondary | ICD-10-CM | POA: Diagnosis not present

## 2016-07-17 DIAGNOSIS — Z1329 Encounter for screening for other suspected endocrine disorder: Secondary | ICD-10-CM | POA: Diagnosis not present

## 2016-07-17 DIAGNOSIS — R2689 Other abnormalities of gait and mobility: Secondary | ICD-10-CM | POA: Diagnosis not present

## 2016-07-17 DIAGNOSIS — M25552 Pain in left hip: Secondary | ICD-10-CM | POA: Diagnosis not present

## 2016-07-17 DIAGNOSIS — H9193 Unspecified hearing loss, bilateral: Secondary | ICD-10-CM | POA: Diagnosis not present

## 2016-07-17 DIAGNOSIS — I10 Essential (primary) hypertension: Secondary | ICD-10-CM | POA: Diagnosis not present

## 2016-07-18 ENCOUNTER — Other Ambulatory Visit: Payer: Self-pay | Admitting: Licensed Clinical Social Worker

## 2016-07-18 NOTE — Patient Outreach (Signed)
Assessment:  CSW spoke via phone with client on 07/18/16. CSW verified client identity. CSW and client spoke of client needs. Client is receiving [facility] nursing support with RN Janalyn Shy.  Client said he had his prescribed medications and is taking medications as prescribed.  He said he sometimes drives himself to scheduled medical appointments.  Client sees Dr. Reynaldo Minium as primary care doctor. CSW had previously provided client with name and phone number for [facility] to utilize if needed. Client also said a friend sometimes helps in transporting client to and from client's medical appointments. Client uses a walker to ambulate. CSW and client spoke of client care plan. CSW encouraged client to communicate with CSW in next 30 days to discuss transport resources for client in the area.  Client said he receives [employer]als [employer] Wheels lunch meal delivery 5 days weekly. Client said he has had 8 hip surgeries on left hip. He said he is now starting to have pain in his right leg.  He said he takes pain medication as prescribed.  Client said he had a medical appointment with Dr. Reynaldo Minium yesterday. Client said he went to medical appointment with Dr. Reynaldo Minium yesterday by using [facility].  Client said he was well pleased with transport assist with [facility]. Client said he sometimes has difficulty sleeping due to ongoing pain issues.  Client said Dr. Reynaldo Minium spoke with client recently about outpatient physical therapy support for client.  CSW spoke with client about [facility] program support. CSW encouraged client to call CSW at [phone] as needed to discuss social work needs of client.  CSW thanked client for phone call with CSW on 07/18/16. Client was appreciative of phone call from [place] on 07/18/16.   Plan:  Client to communicate with CSW in next 30 days to discuss transport resources for client in the area.  CSW to collaborate with RN Janalyn Shy in monitoring client  needs.  CSW to call client in 4 weeks to assess client needs.  Norva Riffle.Ayona Yniguez MSW, LCSW Licensed Clinical Social Worker [facility] Care Management [phone]

## 2016-07-19 ENCOUNTER — Other Ambulatory Visit: Payer: Self-pay | Admitting: *Deleted

## 2016-07-19 NOTE — Patient Outreach (Signed)
[facility]) Care Management  07/19/2016  OTILIO LORIA 10/26/1941 [id number]   Returned call received from [facility]. Ms.Lovelace says [facility] does not provide services in Mr. Giannakopoulos geographic area. She gave me the name of Jeanine @ [employer]. I left a voice message @ [phone] requesting a return call regarding in home care services for Mr. Rehfeldt.   Plan: I will follow up with [facility] next week if I do not hear from them today.    [facility]  [phone]

## 2016-08-01 ENCOUNTER — Other Ambulatory Visit: Payer: Self-pay | Admitting: *Deleted

## 2016-08-01 NOTE — Patient Outreach (Signed)
[facility]) Care Management  08/01/2016  JUSTICE SAWAYA [birth year] [id number]  I have received several messages from Mr. Abler regarding home health PT orders. Mr. Gantner understood that Dr. Reynaldo Minium may have ordered home health PT for him but he has not heard from anyone (home care agency) re: services. I called Dr. Jacquiline Doe office today, leaving a message for [facility], requesting a call be made to Mr. Slemmer regarding the home health orders.   Mr. Kitagawa asked if I intended to continue seeing him at home and I advised that because the case manager at [facility] was providing the same services and was seeing him at home weekly, I would not continue seeing Mr. Barbieri at home beyond our next home visit. He told me that he was only involved with senior resources when he went with them to deliver meals twice weekly for meals on wheels. I explained that when I last spoke with Ashby Dawes, his case Programmer, applications, she explained that she would help Mr. Leza arrange to be picked up by [employer] transportation services to come to the [facility] 2-3 times/weekly where he could participate in programs and receive a lunch meal on site, staying for 3-4 hours, then be transported back to home. Mr. Tellman said he'd not heard about that option but would be willing to talk with Ashby Dawes about it. When I spoke with Ashby Dawes she said she'd shared this option with Mr. Tardie several times over the last year.  Plan: I will follow up with Mr. Lagrone by phone next week and we will plan our last home visit for some time in the next 2 weeks.    [place] [facility]  [phone]

## 2016-08-06 ENCOUNTER — Other Ambulatory Visit: Payer: Self-pay | Admitting: *Deleted

## 2016-08-06 NOTE — Patient Outreach (Addendum)
[place] [facility]) Care Management  08/06/2016  Jake Adams 1940-11-26 [id number]  Jake Adams is an 75 y.o. gentleman with history of hypertension,dysrhythmia, left total hip replacement, pumonary embolism, gastroesophageal reflux disease, PVC, anxiety, hypercholesterolemia, and heart failure.   Jake Adams was referred to [place] [facility] by his primary care provider for assistance with care coordination, chronic disease management, and medication management. In addition, Jake Adams is admittedly having difficulty with ADL's, IADL's but wants to be able to stay in his home and live independently as long as possible. He reports last having had a fall a year ago and says he has trouble getting around because of hip and joint problems. He has been told he needs hip replacement surgery. As noted, he is widowed as of 2 years ago and lives in his home alone. His son lives in [place]. Jake Adams has been relying on friends to check in on him.   We have been working on medication management and fall risk reduction including installation of DME.    I reached out to Jake Adams today to follow up on his recent PT referral.   Subjective: "I think all I need to do now is make some kinds of arrangements for a ride over to [facility] for therapy."  Assessment:  75 year old gentleman living at home alone in [place], [place]. Jake Adams has been receiving services from [employer] on Wheels, transportation services, and weekly visits from a care coordinator.    When I last spoke with Jake Adams he said he was going to call his contact at [facility] to check on arrangements for transportation to outpatient PT in [place]. When I spoke with him today, he said he thought I was going to take care of this. I called Therapist, music and found out that his care coordinator is out this week.   Plan: I will follow up with [place] to ensure that  care coordination services are in place prior to my discharging Jake Adams from [place].    [place] [facility]  [phone]

## 2016-08-15 ENCOUNTER — Other Ambulatory Visit: Payer: Self-pay | Admitting: *Deleted

## 2016-08-15 ENCOUNTER — Encounter: Payer: Self-pay | Admitting: *Deleted

## 2016-08-15 NOTE — Patient Outreach (Signed)
[place] [facility]) Care Management  08/15/2016  Jake Adams 27-Apr-1941 [id number]  Call received from [name] today to ask about in home care services and senior resources. [name] is still interested in senior resources and hasn't spoken with anyone at [facility] over the last 2 weeks.   Plan: I will reach out to [facility] [phone]) tomorrow on [name] behalf to discuss transition to Senior Resources for ongoing case management as [name] clinical case management needs have been met.    [place] [facility]  [phone]

## 2016-08-16 ENCOUNTER — Other Ambulatory Visit: Payer: Self-pay | Admitting: *Deleted

## 2016-08-16 NOTE — Patient Outreach (Signed)
[place] [facility]) Care Management  08/16/2016  JR KOVACICH 07/14/1941 [id number]  I left a message for Louann Sjogren at [facility] [phone]) today regarding [name] desire to become more involved in the on site senior resource programs available in [place]. I asked Ms. Everidge to return a call to me so we can discuss further.   Plan: I will follow up with [name] next week regarding engagement with [facility].    [place] Management  [phone]

## 2016-08-21 ENCOUNTER — Other Ambulatory Visit: Payer: Self-pay | Admitting: Licensed Clinical Social Worker

## 2016-08-21 NOTE — Patient Outreach (Signed)
Assessment:  CSW spoke via phone with client.  CSW verified client identity. CSW and client spoke of client needs.  Client is receiving [facility] nursing support with RN Janalyn Shy.  RN Janalyn Shy and CSW have discussed current needs of client.  Client has been encouraged to start participating in [facility] activities. He has been offered transport support to and from [facility] via [facility]. Client has a friend that occasionally helps transport him to and from medical appointments of client. Client spoke with CSW about client having a spot on his left ear that concerned him. CSW encouraged client to communicate with Dr. Reynaldo Minium, client's primary doctor or nurse of Dr. Reynaldo Minium to discuss spot on client's left ear.  Client said it had been several months since client had seen Dr. Reynaldo Minium. CSW encouraged client to consider participating in [facility] activities several times weekly for socialization.  RN Janalyn Shy has talked also with client about his participation in [facility] activities.  RN Janalyn Shy has contacted Louann Sjogren, caseworker at [facility] (phone number [phone]), and asked Ashby Dawes to call client to discuss client involvement at [facility].  CSW and client spoke of client care plan. CSW encouraged client to communicate with CSW in next 30 days to discuss transport resources for client in the area. CSW also talked with client about client's use of [facility] for current medical transport needs of client.  CSW thanked client for phone conversation with CSW . CSW encouraged client to call CSW at [phone] as needed to discus social work needs of client.   Plan:  Client to communicate with CSW in next 30 days to discuss transport resources for client in the area.  CSW to collaborate with RN Janalyn Shy in monitoring needs of client.  CSW to  call client in 4 weeks to assess client needs.   Norva Riffle.Cailan Antonucci MSW, LCSW Licensed Clinical Social Worker [facility] Care Management [phone]

## 2016-08-23 ENCOUNTER — Other Ambulatory Visit: Payer: Self-pay | Admitting: *Deleted

## 2016-08-23 NOTE — Patient Outreach (Signed)
[place]) Care Management  08/23/2016  NICHOLIS HENNIGAN 07/23/1941 [id number]  Unable to reach Mr. Hays by phone today. 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   Plan: I will follow up with Mr. Janacek by phone next week to follow up on transition to on site [employer].    [place] Management  [phone]

## 2016-08-30 ENCOUNTER — Other Ambulatory Visit: Payer: Self-pay | Admitting: *Deleted

## 2016-08-30 NOTE — Patient Outreach (Signed)
[place] [facility]) Care Management  08/30/2016  Jake Adams 1940/11/14 [id number]   Unable to reach [name] by phone this morning. 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   Plan: I will reach out to [name] and will follow up with Senior [facility] of [facility] again next week in anticipation of discharge.    [place] Management  [phone]

## 2016-09-06 ENCOUNTER — Ambulatory Visit: Payer: Self-pay | Admitting: *Deleted

## 2016-09-09 ENCOUNTER — Other Ambulatory Visit: Payer: Self-pay | Admitting: *Deleted

## 2016-09-09 NOTE — Patient Outreach (Signed)
[place] [facility]) Care Management  09/09/2016  Jake Adams [birth year] [id number]

## 2016-09-10 ENCOUNTER — Encounter: Payer: Self-pay | Admitting: *Deleted

## 2016-09-10 ENCOUNTER — Other Ambulatory Visit: Payer: Self-pay | Admitting: *Deleted

## 2016-09-10 NOTE — Patient Outreach (Signed)
[facility]) Care Management  09/10/2016  Jake Adams Jun 23, 1941 [id number]  Follow up call regarding case management services for Jake Adams. He is actively being followed by [employer] and is seen weekly in the home and participates in community services in [place] ([place]: Louann Sjogren [place] [facility] [place] [facility] [phone]).   As Jake Adams and I previously discussed, I will close his case for nursing services as his nursing care management goals have been met.   Social work is still involved in Jake Adams care.    [place] Management  [phone]

## 2016-09-23 ENCOUNTER — Encounter: Payer: Self-pay | Admitting: Licensed Clinical Social Worker

## 2016-09-23 ENCOUNTER — Other Ambulatory Visit: Payer: Self-pay | Admitting: Licensed Clinical Social Worker

## 2016-09-23 NOTE — Patient Outreach (Signed)
Assessment:  CSW spoke via phone with client on 09/23/16. CSW verified client identity.  CSW and client spoke of client needs.  RN Janalyn Shy had discharged client recently  from [place].  Client is currently attending [facility] weekly as scheduled. He has [facility] that transports him weekly to and from [facility]. Client also has a Product/process development scientist, Oncologist (phone: [phone]), from [facility] who visits him weekly and monitors his current needs.  Client has met his care plan goals with [facility] CSW services. Thus, CSW informed client on 09/23/16 that client had met client care plan goals with CSW services. Thus, CSW informed client on 09/23/16 that [place] would discharge client from [place] services on 09/23/16. Client agreed to this plan. CSW congratulated client on meeting his care plan goals with [facility] CSW services. CSW encouraged client to continue participating as scheduled weekly at [facility].  CSW encouraged client to continue to communicate with his caseworker, Louann Sjogren, to discuss ongoing client needs.  Client has prescribed medications and is taking medications as prescribed. He has no transport needs at present. Client was appreciative [facility] program support in recent months.   Plan:  CSW is discharging Princess Perna from [facility] CSW services on 09/23/16 since client has met his care plan goals with CSW services.  CSW to inform Josepha Pigg that [place] discharged client on 09/23/16 from [place] services.  CSW to fax physician case closure letter to Dr. Reynaldo Minium informing Dr. Reynaldo Minium that [place] discharged client from [facility] CSW services on 09/23/16.  Norva Riffle.Lucifer Soja MSW, LCSW Licensed Clinical Social Worker [facility] Care Management [phone]

## 2016-10-11 DIAGNOSIS — R2689 Other abnormalities of gait and mobility: Secondary | ICD-10-CM | POA: Diagnosis not present

## 2016-10-11 DIAGNOSIS — M4800 Spinal stenosis, site unspecified: Secondary | ICD-10-CM | POA: Diagnosis not present

## 2016-10-11 DIAGNOSIS — M25552 Pain in left hip: Secondary | ICD-10-CM | POA: Diagnosis not present

## 2016-10-11 DIAGNOSIS — R531 Weakness: Secondary | ICD-10-CM | POA: Diagnosis not present

## 2016-10-14 DIAGNOSIS — M4800 Spinal stenosis, site unspecified: Secondary | ICD-10-CM | POA: Diagnosis not present

## 2016-10-14 DIAGNOSIS — M25552 Pain in left hip: Secondary | ICD-10-CM | POA: Diagnosis not present

## 2016-10-14 DIAGNOSIS — R2689 Other abnormalities of gait and mobility: Secondary | ICD-10-CM | POA: Diagnosis not present

## 2016-10-14 DIAGNOSIS — R531 Weakness: Secondary | ICD-10-CM | POA: Diagnosis not present

## 2016-10-18 DIAGNOSIS — R2689 Other abnormalities of gait and mobility: Secondary | ICD-10-CM | POA: Diagnosis not present

## 2016-10-18 DIAGNOSIS — M25552 Pain in left hip: Secondary | ICD-10-CM | POA: Diagnosis not present

## 2016-10-18 DIAGNOSIS — M4800 Spinal stenosis, site unspecified: Secondary | ICD-10-CM | POA: Diagnosis not present

## 2016-10-18 DIAGNOSIS — R531 Weakness: Secondary | ICD-10-CM | POA: Diagnosis not present

## 2016-10-21 DIAGNOSIS — M25552 Pain in left hip: Secondary | ICD-10-CM | POA: Diagnosis not present

## 2016-10-21 DIAGNOSIS — R2689 Other abnormalities of gait and mobility: Secondary | ICD-10-CM | POA: Diagnosis not present

## 2016-10-21 DIAGNOSIS — R531 Weakness: Secondary | ICD-10-CM | POA: Diagnosis not present

## 2016-10-21 DIAGNOSIS — M4800 Spinal stenosis, site unspecified: Secondary | ICD-10-CM | POA: Diagnosis not present

## 2016-10-22 DIAGNOSIS — M25552 Pain in left hip: Secondary | ICD-10-CM | POA: Diagnosis not present

## 2016-10-22 DIAGNOSIS — R531 Weakness: Secondary | ICD-10-CM | POA: Diagnosis not present

## 2016-10-22 DIAGNOSIS — M4800 Spinal stenosis, site unspecified: Secondary | ICD-10-CM | POA: Diagnosis not present

## 2016-10-22 DIAGNOSIS — R2689 Other abnormalities of gait and mobility: Secondary | ICD-10-CM | POA: Diagnosis not present

## 2016-10-23 DIAGNOSIS — R531 Weakness: Secondary | ICD-10-CM | POA: Diagnosis not present

## 2016-10-23 DIAGNOSIS — R2689 Other abnormalities of gait and mobility: Secondary | ICD-10-CM | POA: Diagnosis not present

## 2016-10-23 DIAGNOSIS — M25552 Pain in left hip: Secondary | ICD-10-CM | POA: Diagnosis not present

## 2016-10-23 DIAGNOSIS — M4800 Spinal stenosis, site unspecified: Secondary | ICD-10-CM | POA: Diagnosis not present

## 2016-10-24 DIAGNOSIS — M25552 Pain in left hip: Secondary | ICD-10-CM | POA: Diagnosis not present

## 2016-10-24 DIAGNOSIS — R2689 Other abnormalities of gait and mobility: Secondary | ICD-10-CM | POA: Diagnosis not present

## 2016-10-24 DIAGNOSIS — R531 Weakness: Secondary | ICD-10-CM | POA: Diagnosis not present

## 2016-10-24 DIAGNOSIS — M4800 Spinal stenosis, site unspecified: Secondary | ICD-10-CM | POA: Diagnosis not present

## 2016-10-25 DIAGNOSIS — M4800 Spinal stenosis, site unspecified: Secondary | ICD-10-CM | POA: Diagnosis not present

## 2016-10-25 DIAGNOSIS — R531 Weakness: Secondary | ICD-10-CM | POA: Diagnosis not present

## 2016-10-25 DIAGNOSIS — M25552 Pain in left hip: Secondary | ICD-10-CM | POA: Diagnosis not present

## 2016-10-25 DIAGNOSIS — R2689 Other abnormalities of gait and mobility: Secondary | ICD-10-CM | POA: Diagnosis not present

## 2016-10-31 DIAGNOSIS — M4800 Spinal stenosis, site unspecified: Secondary | ICD-10-CM | POA: Diagnosis not present

## 2016-10-31 DIAGNOSIS — R2689 Other abnormalities of gait and mobility: Secondary | ICD-10-CM | POA: Diagnosis not present

## 2016-10-31 DIAGNOSIS — R531 Weakness: Secondary | ICD-10-CM | POA: Diagnosis not present

## 2016-10-31 DIAGNOSIS — M25552 Pain in left hip: Secondary | ICD-10-CM | POA: Diagnosis not present

## 2016-11-01 DIAGNOSIS — M4800 Spinal stenosis, site unspecified: Secondary | ICD-10-CM | POA: Diagnosis not present

## 2016-11-01 DIAGNOSIS — M25552 Pain in left hip: Secondary | ICD-10-CM | POA: Diagnosis not present

## 2016-11-01 DIAGNOSIS — R2689 Other abnormalities of gait and mobility: Secondary | ICD-10-CM | POA: Diagnosis not present

## 2016-11-01 DIAGNOSIS — R531 Weakness: Secondary | ICD-10-CM | POA: Diagnosis not present

## 2016-11-05 DIAGNOSIS — M4800 Spinal stenosis, site unspecified: Secondary | ICD-10-CM | POA: Diagnosis not present

## 2016-11-05 DIAGNOSIS — R531 Weakness: Secondary | ICD-10-CM | POA: Diagnosis not present

## 2016-11-05 DIAGNOSIS — R2689 Other abnormalities of gait and mobility: Secondary | ICD-10-CM | POA: Diagnosis not present

## 2016-11-05 DIAGNOSIS — M25552 Pain in left hip: Secondary | ICD-10-CM | POA: Diagnosis not present

## 2016-11-06 DIAGNOSIS — M25552 Pain in left hip: Secondary | ICD-10-CM | POA: Diagnosis not present

## 2016-11-06 DIAGNOSIS — R531 Weakness: Secondary | ICD-10-CM | POA: Diagnosis not present

## 2016-11-06 DIAGNOSIS — R2689 Other abnormalities of gait and mobility: Secondary | ICD-10-CM | POA: Diagnosis not present

## 2016-11-06 DIAGNOSIS — M4800 Spinal stenosis, site unspecified: Secondary | ICD-10-CM | POA: Diagnosis not present

## 2016-11-08 DIAGNOSIS — I10 Essential (primary) hypertension: Secondary | ICD-10-CM | POA: Diagnosis not present

## 2016-11-08 DIAGNOSIS — E785 Hyperlipidemia, unspecified: Secondary | ICD-10-CM | POA: Diagnosis not present

## 2016-11-08 DIAGNOSIS — R197 Diarrhea, unspecified: Secondary | ICD-10-CM | POA: Diagnosis not present

## 2016-11-11 DIAGNOSIS — R2689 Other abnormalities of gait and mobility: Secondary | ICD-10-CM | POA: Diagnosis not present

## 2016-11-11 DIAGNOSIS — R531 Weakness: Secondary | ICD-10-CM | POA: Diagnosis not present

## 2016-11-11 DIAGNOSIS — M25552 Pain in left hip: Secondary | ICD-10-CM | POA: Diagnosis not present

## 2016-11-11 DIAGNOSIS — M4800 Spinal stenosis, site unspecified: Secondary | ICD-10-CM | POA: Diagnosis not present

## 2016-11-12 ENCOUNTER — Other Ambulatory Visit: Payer: Self-pay

## 2016-11-12 NOTE — Patient Outreach (Signed)
[place] [facility]) Care Management  11/12/2016  Jake Adams 1941/10/24 [id number]     Telephone Screen  Referral Date: 11/11/16 Referral Source: Nurse Call Center Report Referral Reason: " caller having depression, wife died a few years ago, wants to speak with someone"    Outreach attempt #  1 to patient. Spoke with patient. Reviewed and addressed red alert. Patient reports that the other day he was feeling down and very sad and lonely. He states his wife past a few years ago. He has a picture of her in his bedroom and looks at her every morning when he first wakes up. The other morning, was harder than normal for him to look at her and not breakdown and cry and feel sad. Patient pleased to report that he is feeling a lot better today. He voices that he continues to miss his spouse but has a strong faith belief that gets him through hard and lonely times. patient denies any suicidal/homicidal thoughts. He declined the need for professional counseling and voiced that relies on his faith. No RN CM needs or concerns at this time. Patient was appreciative of f/u call.     Plan: RN CM will notify [facility] administrative assistant of case status.   Enzo Montgomery, RN,BSN,CCM [place] Management Telephonic Care Management Coordinator Direct Phone: [phone] Toll Free: [phone] Fax: [phone]

## 2016-11-13 DIAGNOSIS — M4800 Spinal stenosis, site unspecified: Secondary | ICD-10-CM | POA: Diagnosis not present

## 2016-11-13 DIAGNOSIS — M25552 Pain in left hip: Secondary | ICD-10-CM | POA: Diagnosis not present

## 2016-11-13 DIAGNOSIS — R2689 Other abnormalities of gait and mobility: Secondary | ICD-10-CM | POA: Diagnosis not present

## 2016-11-13 DIAGNOSIS — R531 Weakness: Secondary | ICD-10-CM | POA: Diagnosis not present

## 2016-11-15 DIAGNOSIS — M4800 Spinal stenosis, site unspecified: Secondary | ICD-10-CM | POA: Diagnosis not present

## 2016-11-15 DIAGNOSIS — M25552 Pain in left hip: Secondary | ICD-10-CM | POA: Diagnosis not present

## 2016-11-15 DIAGNOSIS — R531 Weakness: Secondary | ICD-10-CM | POA: Diagnosis not present

## 2016-11-15 DIAGNOSIS — R2689 Other abnormalities of gait and mobility: Secondary | ICD-10-CM | POA: Diagnosis not present

## 2016-11-19 DIAGNOSIS — M25552 Pain in left hip: Secondary | ICD-10-CM | POA: Diagnosis not present

## 2016-11-19 DIAGNOSIS — R531 Weakness: Secondary | ICD-10-CM | POA: Diagnosis not present

## 2016-11-19 DIAGNOSIS — R2689 Other abnormalities of gait and mobility: Secondary | ICD-10-CM | POA: Diagnosis not present

## 2016-11-19 DIAGNOSIS — M4800 Spinal stenosis, site unspecified: Secondary | ICD-10-CM | POA: Diagnosis not present

## 2016-11-22 DIAGNOSIS — M25552 Pain in left hip: Secondary | ICD-10-CM | POA: Diagnosis not present

## 2016-11-22 DIAGNOSIS — M4800 Spinal stenosis, site unspecified: Secondary | ICD-10-CM | POA: Diagnosis not present

## 2016-11-22 DIAGNOSIS — R2689 Other abnormalities of gait and mobility: Secondary | ICD-10-CM | POA: Diagnosis not present

## 2016-11-22 DIAGNOSIS — R531 Weakness: Secondary | ICD-10-CM | POA: Diagnosis not present

## 2016-11-25 DIAGNOSIS — R531 Weakness: Secondary | ICD-10-CM | POA: Diagnosis not present

## 2016-11-25 DIAGNOSIS — M4800 Spinal stenosis, site unspecified: Secondary | ICD-10-CM | POA: Diagnosis not present

## 2016-11-25 DIAGNOSIS — R2689 Other abnormalities of gait and mobility: Secondary | ICD-10-CM | POA: Diagnosis not present

## 2016-11-25 DIAGNOSIS — M25552 Pain in left hip: Secondary | ICD-10-CM | POA: Diagnosis not present

## 2016-11-27 DIAGNOSIS — M25562 Pain in left knee: Secondary | ICD-10-CM | POA: Diagnosis not present

## 2016-11-27 DIAGNOSIS — Z96651 Presence of right artificial knee joint: Secondary | ICD-10-CM | POA: Diagnosis not present

## 2016-11-27 DIAGNOSIS — M1712 Unilateral primary osteoarthritis, left knee: Secondary | ICD-10-CM | POA: Diagnosis not present

## 2016-11-27 DIAGNOSIS — M25561 Pain in right knee: Secondary | ICD-10-CM | POA: Diagnosis not present

## 2016-11-28 DIAGNOSIS — R2689 Other abnormalities of gait and mobility: Secondary | ICD-10-CM | POA: Diagnosis not present

## 2016-11-28 DIAGNOSIS — M4800 Spinal stenosis, site unspecified: Secondary | ICD-10-CM | POA: Diagnosis not present

## 2016-11-28 DIAGNOSIS — R531 Weakness: Secondary | ICD-10-CM | POA: Diagnosis not present

## 2016-11-28 DIAGNOSIS — M25552 Pain in left hip: Secondary | ICD-10-CM | POA: Diagnosis not present

## 2016-11-29 DIAGNOSIS — M4800 Spinal stenosis, site unspecified: Secondary | ICD-10-CM | POA: Diagnosis not present

## 2016-11-29 DIAGNOSIS — M25552 Pain in left hip: Secondary | ICD-10-CM | POA: Diagnosis not present

## 2016-11-29 DIAGNOSIS — R2689 Other abnormalities of gait and mobility: Secondary | ICD-10-CM | POA: Diagnosis not present

## 2016-11-29 DIAGNOSIS — R531 Weakness: Secondary | ICD-10-CM | POA: Diagnosis not present

## 2016-12-02 DIAGNOSIS — R531 Weakness: Secondary | ICD-10-CM | POA: Diagnosis not present

## 2016-12-02 DIAGNOSIS — M25552 Pain in left hip: Secondary | ICD-10-CM | POA: Diagnosis not present

## 2016-12-02 DIAGNOSIS — M4800 Spinal stenosis, site unspecified: Secondary | ICD-10-CM | POA: Diagnosis not present

## 2016-12-02 DIAGNOSIS — R2689 Other abnormalities of gait and mobility: Secondary | ICD-10-CM | POA: Diagnosis not present

## 2016-12-04 DIAGNOSIS — M25552 Pain in left hip: Secondary | ICD-10-CM | POA: Diagnosis not present

## 2016-12-04 DIAGNOSIS — M4800 Spinal stenosis, site unspecified: Secondary | ICD-10-CM | POA: Diagnosis not present

## 2016-12-04 DIAGNOSIS — R531 Weakness: Secondary | ICD-10-CM | POA: Diagnosis not present

## 2016-12-04 DIAGNOSIS — R2689 Other abnormalities of gait and mobility: Secondary | ICD-10-CM | POA: Diagnosis not present

## 2016-12-06 DIAGNOSIS — R2689 Other abnormalities of gait and mobility: Secondary | ICD-10-CM | POA: Diagnosis not present

## 2016-12-06 DIAGNOSIS — R531 Weakness: Secondary | ICD-10-CM | POA: Diagnosis not present

## 2016-12-06 DIAGNOSIS — M25552 Pain in left hip: Secondary | ICD-10-CM | POA: Diagnosis not present

## 2016-12-06 DIAGNOSIS — M4800 Spinal stenosis, site unspecified: Secondary | ICD-10-CM | POA: Diagnosis not present

## 2016-12-09 DIAGNOSIS — R2689 Other abnormalities of gait and mobility: Secondary | ICD-10-CM | POA: Diagnosis not present

## 2016-12-09 DIAGNOSIS — M25552 Pain in left hip: Secondary | ICD-10-CM | POA: Diagnosis not present

## 2016-12-09 DIAGNOSIS — R531 Weakness: Secondary | ICD-10-CM | POA: Diagnosis not present

## 2016-12-09 DIAGNOSIS — M4800 Spinal stenosis, site unspecified: Secondary | ICD-10-CM | POA: Diagnosis not present

## 2016-12-10 DIAGNOSIS — R531 Weakness: Secondary | ICD-10-CM | POA: Diagnosis not present

## 2016-12-10 DIAGNOSIS — M25552 Pain in left hip: Secondary | ICD-10-CM | POA: Diagnosis not present

## 2016-12-10 DIAGNOSIS — R2689 Other abnormalities of gait and mobility: Secondary | ICD-10-CM | POA: Diagnosis not present

## 2016-12-10 DIAGNOSIS — M4800 Spinal stenosis, site unspecified: Secondary | ICD-10-CM | POA: Diagnosis not present

## 2016-12-11 DIAGNOSIS — R2689 Other abnormalities of gait and mobility: Secondary | ICD-10-CM | POA: Diagnosis not present

## 2016-12-11 DIAGNOSIS — R531 Weakness: Secondary | ICD-10-CM | POA: Diagnosis not present

## 2016-12-11 DIAGNOSIS — M4800 Spinal stenosis, site unspecified: Secondary | ICD-10-CM | POA: Diagnosis not present

## 2016-12-11 DIAGNOSIS — M25552 Pain in left hip: Secondary | ICD-10-CM | POA: Diagnosis not present

## 2016-12-12 DIAGNOSIS — M1611 Unilateral primary osteoarthritis, right hip: Secondary | ICD-10-CM | POA: Diagnosis not present

## 2016-12-13 DIAGNOSIS — R2689 Other abnormalities of gait and mobility: Secondary | ICD-10-CM | POA: Diagnosis not present

## 2016-12-13 DIAGNOSIS — R531 Weakness: Secondary | ICD-10-CM | POA: Diagnosis not present

## 2016-12-13 DIAGNOSIS — M4800 Spinal stenosis, site unspecified: Secondary | ICD-10-CM | POA: Diagnosis not present

## 2016-12-13 DIAGNOSIS — M25552 Pain in left hip: Secondary | ICD-10-CM | POA: Diagnosis not present

## 2016-12-16 DIAGNOSIS — M25552 Pain in left hip: Secondary | ICD-10-CM | POA: Diagnosis not present

## 2016-12-16 DIAGNOSIS — R2689 Other abnormalities of gait and mobility: Secondary | ICD-10-CM | POA: Diagnosis not present

## 2016-12-16 DIAGNOSIS — M4800 Spinal stenosis, site unspecified: Secondary | ICD-10-CM | POA: Diagnosis not present

## 2016-12-16 DIAGNOSIS — R531 Weakness: Secondary | ICD-10-CM | POA: Diagnosis not present

## 2016-12-18 DIAGNOSIS — R2689 Other abnormalities of gait and mobility: Secondary | ICD-10-CM | POA: Diagnosis not present

## 2016-12-18 DIAGNOSIS — M4800 Spinal stenosis, site unspecified: Secondary | ICD-10-CM | POA: Diagnosis not present

## 2016-12-18 DIAGNOSIS — M25552 Pain in left hip: Secondary | ICD-10-CM | POA: Diagnosis not present

## 2016-12-18 DIAGNOSIS — R531 Weakness: Secondary | ICD-10-CM | POA: Diagnosis not present

## 2016-12-20 DIAGNOSIS — M4800 Spinal stenosis, site unspecified: Secondary | ICD-10-CM | POA: Diagnosis not present

## 2016-12-20 DIAGNOSIS — R531 Weakness: Secondary | ICD-10-CM | POA: Diagnosis not present

## 2016-12-20 DIAGNOSIS — M25552 Pain in left hip: Secondary | ICD-10-CM | POA: Diagnosis not present

## 2016-12-20 DIAGNOSIS — R2689 Other abnormalities of gait and mobility: Secondary | ICD-10-CM | POA: Diagnosis not present

## 2016-12-23 DIAGNOSIS — R531 Weakness: Secondary | ICD-10-CM | POA: Diagnosis not present

## 2016-12-23 DIAGNOSIS — R2689 Other abnormalities of gait and mobility: Secondary | ICD-10-CM | POA: Diagnosis not present

## 2016-12-23 DIAGNOSIS — M4800 Spinal stenosis, site unspecified: Secondary | ICD-10-CM | POA: Diagnosis not present

## 2016-12-23 DIAGNOSIS — M25552 Pain in left hip: Secondary | ICD-10-CM | POA: Diagnosis not present

## 2016-12-25 DIAGNOSIS — M1712 Unilateral primary osteoarthritis, left knee: Secondary | ICD-10-CM | POA: Diagnosis not present

## 2016-12-25 DIAGNOSIS — Z96651 Presence of right artificial knee joint: Secondary | ICD-10-CM | POA: Diagnosis not present

## 2016-12-25 DIAGNOSIS — R2689 Other abnormalities of gait and mobility: Secondary | ICD-10-CM | POA: Diagnosis not present

## 2016-12-25 DIAGNOSIS — M25552 Pain in left hip: Secondary | ICD-10-CM | POA: Diagnosis not present

## 2016-12-25 DIAGNOSIS — M1611 Unilateral primary osteoarthritis, right hip: Secondary | ICD-10-CM | POA: Diagnosis not present

## 2016-12-25 DIAGNOSIS — R531 Weakness: Secondary | ICD-10-CM | POA: Diagnosis not present

## 2016-12-25 DIAGNOSIS — M4800 Spinal stenosis, site unspecified: Secondary | ICD-10-CM | POA: Diagnosis not present

## 2016-12-26 DIAGNOSIS — R2689 Other abnormalities of gait and mobility: Secondary | ICD-10-CM | POA: Diagnosis not present

## 2016-12-26 DIAGNOSIS — R531 Weakness: Secondary | ICD-10-CM | POA: Diagnosis not present

## 2016-12-26 DIAGNOSIS — M4800 Spinal stenosis, site unspecified: Secondary | ICD-10-CM | POA: Diagnosis not present

## 2016-12-26 DIAGNOSIS — M25552 Pain in left hip: Secondary | ICD-10-CM | POA: Diagnosis not present

## 2017-01-01 DIAGNOSIS — R600 Localized edema: Secondary | ICD-10-CM | POA: Diagnosis not present

## 2017-01-01 DIAGNOSIS — Z96651 Presence of right artificial knee joint: Secondary | ICD-10-CM | POA: Diagnosis not present

## 2017-01-01 DIAGNOSIS — M25561 Pain in right knee: Secondary | ICD-10-CM | POA: Diagnosis not present

## 2017-01-01 DIAGNOSIS — Z86711 Personal history of pulmonary embolism: Secondary | ICD-10-CM | POA: Diagnosis not present

## 2017-01-01 DIAGNOSIS — Z96642 Presence of left artificial hip joint: Secondary | ICD-10-CM | POA: Diagnosis not present

## 2017-01-01 DIAGNOSIS — I10 Essential (primary) hypertension: Secondary | ICD-10-CM | POA: Diagnosis not present

## 2017-01-01 DIAGNOSIS — E7800 Pure hypercholesterolemia, unspecified: Secondary | ICD-10-CM | POA: Diagnosis not present

## 2017-01-01 DIAGNOSIS — F329 Major depressive disorder, single episode, unspecified: Secondary | ICD-10-CM | POA: Diagnosis not present

## 2017-01-02 ENCOUNTER — Other Ambulatory Visit: Payer: Self-pay

## 2017-01-02 NOTE — Patient Outreach (Signed)
[facility]) Care Management  01/02/2017  Jake Adams 04-08-1941 [id number]   Telephone Screen  Referral Date: 01/02/17 Referral Source: Nurse Call Center Report Referral Reason: "urinary incontinence, caller states having trouble holding his urine-has accidents before he gets to bathroom, wants to know more about prostate CA"    Outreach attempt # 1 to patient. Spoke with patient. Screening completed.   Social: Patient resides in his home alone. He states that his spouse died a few years ago. Patient continues to grieve and tries to cope with loss of spouse. He states that he is independent with ADLs/IADLs. However, he prefers not to drive and relies on his friend to take him to appts and run errands. Patient denies any recent falls but reports that he has had "several near misses." DME in the home include rollator and motorized wheelchair. He states that his powered chair no longer works and someone was supposed to help him get it fixed but they never did. Patient reports that he is fearful that people will try to take him out of his own and make him go to a nursing home. His goal is to remain in the home as long as he can.    Conditions: Patient has PMH of HF,HTN, dysrhythmia and chronic pain. He states that he took nurse from nurse line advice and went to [facility] ED on yesterday. Patient states that he did not find anything wrong. However, he state he is convinced something is going on with his bladder and prostate related to his episodes of urinary incontinence. He also states that he has been having ongoing issues with swelling to lower extremities but it has ben worse than usual. Patient reports that he has scales in his basemen but is unable to physically go down there to get them so he has been unable to weigh. He denies any SOB at present but reports he gives out of breath with exertion. He complains of chronic left hip pain and ongoing issues with hip. No pain  reported during this call. He states he is taking some meds to help manage pain.    Medications:Patient states he takes four pills in the morning and four pills in the evening. He denies any issues with affording meds. He voices that he fills his own med planner weekly.  Appointments: Next PCP appt is 01/21/17. Encouraged patient to consider being seen by PCP sooner given some of the health issues he is facing. He states if he doesn't feel better soon he will consider it.  Advance Directives: None.    Consent: [facility] services reviewed and discussed. Patient familiar with services as he had [facility] in the past. Patient gave verbal consent for [facility] services.   Plan: RN CM will notify [facility] administrative assistant of case status. RN CM will send [facility] community referral for further in home eval and assessment of care needs. RN CM advised patient to contact 24/7 Nurse Line as needed.  RN CM confirmed with patient that he knows when and how to seek medical attention for changes in condition.   Enzo Montgomery, RN,BSN,CCM [place] Management Telephonic Care Management Coordinator Direct Phone: [phone] Toll Free: [phone] Fax: [phone]

## 2017-01-03 ENCOUNTER — Other Ambulatory Visit: Payer: Self-pay | Admitting: *Deleted

## 2017-01-03 NOTE — Patient Outreach (Addendum)
[facility]) Care Management  01/03/2017  DALESSANDRO BALDYGA [birth year] [id number]  Mr. SHIRL LUDINGTON is a 76 year old male with PMH of HF, HTN, dyslipidemia, and chronic pain.  He is widowed as of 2 years ago and lives in his [place] home alone. His son lives in [place]. Mr. Pundt has been relying on friends to check in on him.  Mr neils siracusa is Dr Burnard Bunting    He was referred to [facility] from [place] after referral received from [place]. Mr Bearce reports "urinary incontinence, caller states having trouble holding his urine-has accidents before he gets to bathroom, wants to know more about prostate CA".     Contact made with Mr Denardo to discuss [facility] community referral. Pt expressed that he has leg pain and "is leery of driving" these days and continues to go with meals on wheels staff "[place] I think now once a week" Pt continues  Pt confirms upcoming pcp appointment on 01/21/17 and he will get assist from his friend to get to this appointment   [facility] community program CM is very familiar with this patient and with contact he generally makes the same statement ans requests about feeling lonely and socially isolated. We have offered a variety of services to him and including connecting him with counseling and grief support, in home care providers, meals on wheels volunteer services and [place] senior center services.. Each time he expresses interests but at the point of initiation of services he declines to start.  When Mr Stefanski was las discharged form [facility] community services he was seeing his provider as scheduled, taking all medications as prescribed and had not had falls.  He met all Cm goals and was discharged.     Contact made with Bryna Colander [phone]) to discuss patient and to get and update.  A voice message was left with CM return contact number.   Plan: Await return call from [employer] and follow up  with Mr Venning via Telephone.  Collaboration with LCSW who previously engaged pt to determine if a referral is needed or to gather other recommendation   Chon Buhl L. Lavina Hamman, RN, BSN, [place] Care Management [phone]

## 2017-01-08 ENCOUNTER — Other Ambulatory Visit: Payer: Self-pay | Admitting: Licensed Clinical Social Worker

## 2017-01-13 ENCOUNTER — Encounter: Payer: Self-pay | Admitting: Licensed Clinical Social Worker

## 2017-01-13 ENCOUNTER — Other Ambulatory Visit: Payer: Self-pay | Admitting: Licensed Clinical Social Worker

## 2017-01-16 DIAGNOSIS — H9193 Unspecified hearing loss, bilateral: Secondary | ICD-10-CM | POA: Diagnosis not present

## 2017-01-16 DIAGNOSIS — H6123 Impacted cerumen, bilateral: Secondary | ICD-10-CM | POA: Diagnosis not present

## 2017-01-16 DIAGNOSIS — I10 Essential (primary) hypertension: Secondary | ICD-10-CM | POA: Diagnosis not present

## 2017-01-16 DIAGNOSIS — E784 Other hyperlipidemia: Secondary | ICD-10-CM | POA: Diagnosis not present

## 2017-01-16 DIAGNOSIS — Z6830 Body mass index (BMI) 30.0-30.9, adult: Secondary | ICD-10-CM | POA: Diagnosis not present

## 2017-01-16 DIAGNOSIS — Z125 Encounter for screening for malignant neoplasm of prostate: Secondary | ICD-10-CM | POA: Diagnosis not present

## 2017-02-02 DIAGNOSIS — Z7901 Long term (current) use of anticoagulants: Secondary | ICD-10-CM | POA: Diagnosis not present

## 2017-02-02 DIAGNOSIS — W06XXXA Fall from bed, initial encounter: Secondary | ICD-10-CM | POA: Diagnosis not present

## 2017-02-02 DIAGNOSIS — Z79899 Other long term (current) drug therapy: Secondary | ICD-10-CM | POA: Diagnosis not present

## 2017-02-02 DIAGNOSIS — M25552 Pain in left hip: Secondary | ICD-10-CM | POA: Diagnosis not present

## 2017-02-02 DIAGNOSIS — M1612 Unilateral primary osteoarthritis, left hip: Secondary | ICD-10-CM | POA: Diagnosis not present

## 2017-02-02 DIAGNOSIS — E7800 Pure hypercholesterolemia, unspecified: Secondary | ICD-10-CM | POA: Diagnosis not present

## 2017-02-02 DIAGNOSIS — T148XXA Other injury of unspecified body region, initial encounter: Secondary | ICD-10-CM | POA: Diagnosis not present

## 2017-02-02 DIAGNOSIS — S7002XA Contusion of left hip, initial encounter: Secondary | ICD-10-CM | POA: Diagnosis not present

## 2017-02-02 DIAGNOSIS — I10 Essential (primary) hypertension: Secondary | ICD-10-CM | POA: Diagnosis not present

## 2017-02-02 DIAGNOSIS — Z87891 Personal history of nicotine dependence: Secondary | ICD-10-CM | POA: Diagnosis not present

## 2017-02-07 ENCOUNTER — Telehealth: Payer: Self-pay | Admitting: *Deleted

## 2017-02-07 NOTE — Patient Outreach (Signed)
[facility] [facility]) Care Management  02/07/2017  Jake Adams Jan 31, 1941 [id number]   Jake Adams is a 76 year old male with PMH of HF, HTN, dyslipidemia, and chronic pain.  He is widowed as of 2 years ago and lives in his [place] home alone. His Adams lives in [place], [place]. Jake. Adams has been relying on friends to check in on him. He reports a visit or call daily from his friend, Jake Adams. Jake Adams pcp is Dr Jake Adams    He was referred to this [place] from [place] after a referral was  received from the Nurse Call Center in Adams 2018.  Jake Adams reported "urinary incontinence, caller states having trouble holding his urine-has accidents before he gets to bathroom, wants to know more about prostate CA".     Contact was made with Jake Adams to discuss [facility] community referral in Adams 2018 but he again declines offer for community referrals. Pt expressed that he has leg pain and "is leery of driving" he generally gets assist from his friend to get to this appointments.  [facility] community program is very familiar with this patient and with contact he generally makes the same statements and requests about feeling lonely and socially isolated. He has been offered a variety of services including connecting him with counseling and grief support, in home care providers, meals on wheels volunteer services and [place] senior center services.. Each time he expresses interests but at the point of initiation of services he declines to start.  When Jake Adams was last discharged form [facility] community services he was seeing his provider as scheduled, taking all medications as prescribed and had not had falls.  He met all Cm goals and was discharged.     Contact was made with [facility] [phone]) to discuss patient and to get and update.  A voice message was left with CM return contact number but there was not a return call in [place] 2018.  [facility]  CM reached out to Jake Adams to assess further needs and to get updated on his concerns.  During this April 2018 telephone assessment Jake Adams has stated he was fine except continues to be in pain but "I got medicines for that".  Jake Adams discussed assistance from his friend Jake Adams. Reports that Jake Adams picked him up on 02/07/17 morning and drove him out to get coffee from the [employer] drive thru.  Reports inability to get out of vehicle to go in the restaurant but the visit and company was appreciated.  Jake Adams reports kindness from his mailman which "I have known him all his life"  The mailman placed Jake Kon mailbox at his door versus near the roadside.  He also reports continuing to go out for home visits with the meals on wheels staff but still unable to get out of the vehicle.  Reports his last outing was with Jake Adams versus Jake Adams (has "not seen Jake Adams in [place]")   Jake Adams was allowed to ventilate.  He discussed his medical hx of having "eight surgeries to my left hip and side"  He discussed his pain of his right lower extremity and knee.  He discussed that he does not have a battery for his Hoveround but Jake Adams is willing to assist once Jake Adams purchases the batteries x 2.  Jake Adams reports his Adams has informed him he is searching for the batteries via "Craig's list" versus contacting the [employer]  or a local company for replacements.  THN CM and Jake Ran discussed the general costs of these batteries but Jake Adams is not familiar and unable to tell Cm the type of Hoveround his has used. THN CM encouraged him to locate the type of battery (serial number, name, etc) for his Hoveround to get assist with finding a company to replace the batteries.  Replacement of these batteries would assist him in mobility but he is preferring only assistance from his Adams at this time.    He confirms for transportation to doctor appointments he has consulted [facility].  He has an upcoming appointment on February 17 2017 at his pcp office and confirms he will be taken by [facility]  When Cm inquired about neighbors and church members his denies assistance and confirms has not been to his church locally because it does not have a ramp to enter the facility at this time.  "If I had been at my old church there would have been someone to pick me up and take me in the church"  Jake Adams informed [facility] CM during this call that "between me and you, I will soon be going to visit my wife. I don't tell many people this but I'm ready."   Plans: THN CM will continue to follow up with Jake Adams via telephone to assess needs, allow him to ventilate and offer again assistance with a battery for his mobile DME  [facility] CM Care Plan Problem One     Most Recent Value  Care Plan Problem One  psychoscoial need realte extended grief as evidence by pt verbalization of lonliness  Role Documenting the Problem One  Care Management [place] for Problem One  Not Active  [name]  over the next 31 days pt will verbalize understanding of resources available to him for management psychosocial support   [name]  01/03/17  [facility] Long Term Goal Met Date  02/07/17  Interventions for Problem One Long Term Goal  Discussion with pt that previously discussed: out reach to senior Microbiologist   THN CM Short Term Goal #1 (0-30 days)  over the next 7 days the pt will discuss resources availvable with Musician   [facility] CM Short Term Goal #1 Start Date  01/03/17  [facility] CM Short Term Goal #1 Met Date  02/07/17  Interventions for Short Term Goal #1  telephone outreach to [employer] L. Lavina Hamman, RN, BSN, [place] Care Management [phone]

## 2017-02-14 DIAGNOSIS — Z6830 Body mass index (BMI) 30.0-30.9, adult: Secondary | ICD-10-CM | POA: Diagnosis not present

## 2017-02-14 DIAGNOSIS — R531 Weakness: Secondary | ICD-10-CM | POA: Diagnosis not present

## 2017-02-14 DIAGNOSIS — R32 Unspecified urinary incontinence: Secondary | ICD-10-CM | POA: Diagnosis not present

## 2017-02-14 DIAGNOSIS — R2689 Other abnormalities of gait and mobility: Secondary | ICD-10-CM | POA: Diagnosis not present

## 2017-02-14 DIAGNOSIS — I2699 Other pulmonary embolism without acute cor pulmonale: Secondary | ICD-10-CM | POA: Diagnosis not present

## 2017-02-14 DIAGNOSIS — I10 Essential (primary) hypertension: Secondary | ICD-10-CM | POA: Diagnosis not present

## 2017-02-14 DIAGNOSIS — Z7901 Long term (current) use of anticoagulants: Secondary | ICD-10-CM | POA: Diagnosis not present

## 2017-02-17 DIAGNOSIS — R2689 Other abnormalities of gait and mobility: Secondary | ICD-10-CM | POA: Diagnosis not present

## 2017-02-17 DIAGNOSIS — R32 Unspecified urinary incontinence: Secondary | ICD-10-CM | POA: Diagnosis not present

## 2017-02-17 DIAGNOSIS — Z6830 Body mass index (BMI) 30.0-30.9, adult: Secondary | ICD-10-CM | POA: Diagnosis not present

## 2017-02-17 DIAGNOSIS — F419 Anxiety disorder, unspecified: Secondary | ICD-10-CM | POA: Diagnosis not present

## 2017-02-17 DIAGNOSIS — Z0000 Encounter for general adult medical examination without abnormal findings: Secondary | ICD-10-CM | POA: Diagnosis not present

## 2017-02-17 DIAGNOSIS — I2699 Other pulmonary embolism without acute cor pulmonale: Secondary | ICD-10-CM | POA: Diagnosis not present

## 2017-02-17 DIAGNOSIS — I10 Essential (primary) hypertension: Secondary | ICD-10-CM | POA: Diagnosis not present

## 2017-02-17 DIAGNOSIS — R531 Weakness: Secondary | ICD-10-CM | POA: Diagnosis not present

## 2017-02-17 DIAGNOSIS — M4800 Spinal stenosis, site unspecified: Secondary | ICD-10-CM | POA: Diagnosis not present

## 2017-02-17 DIAGNOSIS — E784 Other hyperlipidemia: Secondary | ICD-10-CM | POA: Diagnosis not present

## 2017-02-17 DIAGNOSIS — Z7901 Long term (current) use of anticoagulants: Secondary | ICD-10-CM | POA: Diagnosis not present

## 2017-02-21 ENCOUNTER — Other Ambulatory Visit: Payer: Self-pay | Admitting: *Deleted

## 2017-03-05 DIAGNOSIS — N401 Enlarged prostate with lower urinary tract symptoms: Secondary | ICD-10-CM | POA: Diagnosis not present

## 2017-03-05 DIAGNOSIS — L89309 Pressure ulcer of unspecified buttock, unspecified stage: Secondary | ICD-10-CM | POA: Diagnosis not present

## 2017-03-13 DIAGNOSIS — Z515 Encounter for palliative care: Secondary | ICD-10-CM | POA: Diagnosis not present

## 2017-03-13 DIAGNOSIS — M9110 Juvenile osteochondrosis of head of femur [Legg-Calve-Perthes], unspecified leg: Secondary | ICD-10-CM | POA: Diagnosis not present

## 2017-03-13 DIAGNOSIS — M25552 Pain in left hip: Secondary | ICD-10-CM | POA: Diagnosis not present

## 2017-03-13 DIAGNOSIS — M4800 Spinal stenosis, site unspecified: Secondary | ICD-10-CM | POA: Diagnosis not present

## 2017-03-29 ENCOUNTER — Encounter (HOSPITAL_COMMUNITY): Payer: Self-pay | Admitting: *Deleted

## 2017-03-29 ENCOUNTER — Emergency Department (HOSPITAL_COMMUNITY): Payer: Medicare Other

## 2017-03-29 ENCOUNTER — Emergency Department (HOSPITAL_COMMUNITY)
Admission: EM | Admit: 2017-03-29 | Discharge: 2017-03-29 | Disposition: A | Discharge status: Home / Self Care | Payer: Medicare Other | Attending: Emergency Medicine | Admitting: Emergency Medicine

## 2017-03-29 DIAGNOSIS — Z7901 Long term (current) use of anticoagulants: Secondary | ICD-10-CM | POA: Insufficient documentation

## 2017-03-29 DIAGNOSIS — M545 Low back pain, unspecified: Secondary | ICD-10-CM

## 2017-03-29 DIAGNOSIS — R52 Pain, unspecified: Secondary | ICD-10-CM | POA: Diagnosis not present

## 2017-03-29 DIAGNOSIS — G8929 Other chronic pain: Secondary | ICD-10-CM

## 2017-03-29 DIAGNOSIS — M25561 Pain in right knee: Secondary | ICD-10-CM | POA: Diagnosis not present

## 2017-03-29 DIAGNOSIS — M25551 Pain in right hip: Secondary | ICD-10-CM | POA: Diagnosis not present

## 2017-03-29 DIAGNOSIS — M79606 Pain in leg, unspecified: Secondary | ICD-10-CM | POA: Diagnosis not present

## 2017-03-29 DIAGNOSIS — M79604 Pain in right leg: Secondary | ICD-10-CM

## 2017-03-29 DIAGNOSIS — Z79899 Other long term (current) drug therapy: Secondary | ICD-10-CM | POA: Insufficient documentation

## 2017-03-29 DIAGNOSIS — I10 Essential (primary) hypertension: Secondary | ICD-10-CM | POA: Diagnosis not present

## 2017-03-29 DIAGNOSIS — Z87891 Personal history of nicotine dependence: Secondary | ICD-10-CM | POA: Diagnosis not present

## 2017-03-29 DIAGNOSIS — M1712 Unilateral primary osteoarthritis, left knee: Secondary | ICD-10-CM | POA: Diagnosis not present

## 2017-03-29 DIAGNOSIS — M25552 Pain in left hip: Secondary | ICD-10-CM | POA: Insufficient documentation

## 2017-03-29 DIAGNOSIS — M47816 Spondylosis without myelopathy or radiculopathy, lumbar region: Secondary | ICD-10-CM | POA: Diagnosis not present

## 2017-03-29 HISTORY — DX: Other chronic pain: G89.29

## 2017-03-29 HISTORY — DX: Pain in left hip: M25.552

## 2017-03-29 HISTORY — DX: Pain in leg, unspecified: M79.606

## 2017-03-29 LAB — CBC WITH DIFFERENTIAL/PLATELET
BASOS ABS: 0 10*3/uL (ref 0.0–0.1)
BASOS PCT: 0 %
EOS ABS: 0.2 10*3/uL (ref 0.0–0.7)
Eosinophils Relative: 4 %
HEMATOCRIT: 30.7 % — AB (ref 39.0–52.0)
HEMOGLOBIN: 9.7 g/dL — AB (ref 13.0–17.0)
Lymphocytes Relative: 21 %
Lymphs Abs: 1.1 10*3/uL (ref 0.7–4.0)
MCH: 25.5 pg — ABNORMAL LOW (ref 26.0–34.0)
MCHC: 31.6 g/dL (ref 30.0–36.0)
MCV: 80.8 fL (ref 78.0–100.0)
MONOS PCT: 14 %
Monocytes Absolute: 0.8 10*3/uL (ref 0.1–1.0)
NEUTROS ABS: 3.1 10*3/uL (ref 1.7–7.7)
NEUTROS PCT: 61 %
Platelets: 264 10*3/uL (ref 150–400)
RBC: 3.8 MIL/uL — AB (ref 4.22–5.81)
RDW: 13.5 % (ref 11.5–15.5)
WBC: 5.2 10*3/uL (ref 4.0–10.5)

## 2017-03-29 LAB — BASIC METABOLIC PANEL
ANION GAP: 7 (ref 5–15)
BUN: 6 mg/dL (ref 6–20)
CHLORIDE: 101 mmol/L (ref 101–111)
CO2: 26 mmol/L (ref 22–32)
Calcium: 8.9 mg/dL (ref 8.9–10.3)
Creatinine, Ser: 0.53 mg/dL — ABNORMAL LOW (ref 0.61–1.24)
GFR calc non Af Amer: >60 mL/min (ref >60)
Glucose, Bld: 97 mg/dL (ref 65–99)
POTASSIUM: 4 mmol/L (ref 3.5–5.1)
SODIUM: 134 mmol/L — AB (ref 135–145)

## 2017-03-29 LAB — URINALYSIS, COMPLETE (UACMP) WITH MICROSCOPIC
BACTERIA UA: NONE SEEN
Bilirubin Urine: NEGATIVE
Glucose, UA: NEGATIVE mg/dL
Hgb urine dipstick: NEGATIVE
KETONES UR: NEGATIVE mg/dL
LEUKOCYTES UA: NEGATIVE
Nitrite: NEGATIVE
PH: 7 (ref 5.0–8.0)
Protein, ur: NEGATIVE mg/dL
RBC / HPF: NONE SEEN RBC/hpf (ref 0–5)
Specific Gravity, Urine: 1.011 (ref 1.005–1.030)

## 2017-03-29 MED ORDER — HYDROCODONE-ACETAMINOPHEN 5-325 MG PO TABS: 1.0000 | ORAL_TABLET | Freq: Once | ORAL | Status: DC

## 2017-03-29 MED ORDER — METHOCARBAMOL 500 MG PO TABS: 500.0000 mg | ORAL_TABLET | Freq: Two times a day (BID) | ORAL | 0 refills | Status: AC | PRN

## 2017-03-29 MED ORDER — MORPHINE SULFATE (PF) 2 MG/ML IV SOLN: 2.0000 mg | INTRAVENOUS | Status: DC | PRN

## 2017-03-29 NOTE — Discharge Instructions (Signed)
Take the prescription as directed.  Apply moist heat or ice to the area(s) of discomfort, for 15 minutes at a time, several times per day for the next few days.  Do not fall asleep on a heating or ice pack.  Call your regular medical doctor on Tuesday to schedule a follow up appointment in the next 3 days.  Return to the Emergency Department immediately if worsening.

## 2017-03-29 NOTE — ED Provider Notes (Signed)
[facility] DEPT Provider Note   CSN: [id number] Arrival date & time: 03/29/17  1257     History   Chief Complaint Chief Complaint  Patient presents with  . Back Pain    HPI Jake Adams is a 76 y.o. male.  HPI  Pt was seen at 1315. Per pt, c/o gradual onset and persistence of constant acute flair of his chronic low back "pain" for the past 1+ months. Has been associated with acute flair of his chronic RLE and left hip pain. Pain worsens with palpation of the area and body position changes. Denies incont/retention of bowel or bladder, no saddle anesthesia, no focal motor weakness, no tingling/numbness in extremities, no fevers, no injury, no abd pain, no dysuria/hematuria, no CP/SOB, no fevers, no testicular pain/swelling.   Past Medical History:  Diagnosis Date  . Anxiety   . Chronic hip pain, left   . Chronic leg pain    right  . Dysrhythmia   . ED (erectile dysfunction)   . Fat embolus ([place]) 1995   POST OP  . GERD (gastroesophageal reflux disease)   . Headache(784.0)    HX OF MIGRAINES  . Hyperlipidemia   . Hypertension   . Osteoarthritis   . Pain    SEVERE BACK PAIN -STATES KYPHOPLASTY DID NOT HELP HIS PAIN  . Pulmonary embolism ([place]) 08/2010  . PVC (premature ventricular contraction)     Patient Active Problem List   Diagnosis Date Noted  . Abnormality of gait 06/07/2016  . Poor short term memory 06/07/2016  . 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 Pulmonary embolism [facility])     Past Surgical History:  Procedure Laterality Date  . EYE SURGERY     B cataracts  . FIXATION KYPHOPLASTY LUMBAR SPINE  04/24/12  . HERNIA REPAIR    . JOINT REPLACEMENT     LEFT TOTAL HIP ARTHROPLASTY AND 6 REVISIONS-LAST WAS 2006;  RIGHT TOTAL KNEE ARTHROPLASTY -1995  . KNEE SURGERY  1995   TKR   . TOTAL HIP REVISION Left 03/18/2013   Procedure: LEFT TOTAL HIP REVISION;  Surgeon: Gearlean Alf, MD;  Location: [facility] ORS;  Service: Orthopedics;  Laterality: Left;        Home Medications    Prior to Admission medications   Medication Sig Start Date End Date Taking? Authorizing Provider  atenolol (TENORMIN) 50 MG tablet Take 50 mg by mouth 2 (two) times daily.    [provider]  citalopram (CELEXA) 10 MG tablet Take 10 mg by mouth daily.    [provider]  HYDROcodone-acetaminophen (NORCO/VICODIN) 5-325 MG tablet Take 1 tablet by mouth every 4 (four) hours as needed for moderate pain. 05/15/16   Renie Ora, MD  LORazepam (ATIVAN) 1 MG tablet Take 2 mg by mouth at bedtime. For anxiety    [provider]  methocarbamol (ROBAXIN) 500 MG tablet Take 1 tablet (500 mg total) by mouth every 6 (six) hours as needed. 03/19/13   Perkins, Alexzandrew L, PA-C  pantoprazole (PROTONIX) 40 MG tablet Take 40 mg by mouth daily.    [provider]  pravastatin (PRAVACHOL) 40 MG tablet Take 40 mg by mouth every evening.     [provider]  Saline (NASOGEL) GEL Place 1 spray into the nose daily.    [provider]  sucralfate (CARAFATE) 1 G tablet Take 1 g by mouth 4 (four) times daily -  with meals and at bedtime.    [provider]  traMADol (  ULTRAM) 50 MG tablet Take 1-2 tablets (50-100 mg total) by mouth every 6 (six) hours as needed (mild pain). 03/19/13   Perkins, Alexzandrew L, PA-C  warfarin (COUMADIN) 2 MG tablet Take 2 tablets (4 mg total) by mouth daily. Take 4mg  by mouth daily starting 5/18 evening. 03/20/13   Gaynelle Arabian, MD    Family History Family History  Problem Relation Age of Onset  . Breast cancer Mother     Social History Social History  Substance Use Topics  . Smoking status: Former Research scientist (life sciences)  . Smokeless tobacco: Never Used     Comment: QUIT SMOKING AT AGE 30  . Alcohol use No     Allergies   Warfarin and related and Penicillins   Review of Systems Review of Systems ROS: Statement: All systems negative except as marked or noted in the HPI; Constitutional: Negative  for fever and chills. ; ; Eyes: Negative for eye pain, redness and discharge. ; ; ENMT: Negative for ear pain, hoarseness, nasal congestion, sinus pressure and sore throat. ; ; Cardiovascular: Negative for chest pain, palpitations, diaphoresis, dyspnea and peripheral edema. ; ; Respiratory: Negative for cough, wheezing and stridor. ; ; Gastrointestinal: Negative for nausea, vomiting, diarrhea, abdominal pain, blood in stool, hematemesis, jaundice and rectal bleeding. . ; ; Genitourinary: Negative for dysuria, flank pain and hematuria. ; ; Genital:  No penile drainage or rash, no testicular pain or swelling, no scrotal rash or swelling. ;; Musculoskeletal: +LBP. Negative for neck pain. Negative for swelling and trauma.; ; Skin: Negative for pruritus, rash, abrasions, blisters, bruising and skin lesion.; ; Neuro: Negative for headache, lightheadedness and neck stiffness. Negative for weakness, altered level of consciousness, altered mental status, extremity weakness, paresthesias, involuntary movement, seizure and syncope.       Physical Exam Updated Vital Signs BP 96/70 (BP Location: Left Arm)   Pulse 84   Temp 98.5 F (36.9 C) (Oral)   Resp 18   Ht 5\' 10"  (1.778 m)   Wt 99.8 kg (220 lb)   SpO2 98%   BMI 31.57 kg/m   Physical Exam 1320: Physical examination:  Nursing notes reviewed; Vital signs and O2 SAT reviewed;  Constitutional: Well developed, Well nourished, Well hydrated, In no acute distress; Head:  Normocephalic, atraumatic; Eyes: EOMI, PERRL, No scleral icterus; ENMT: Mouth and pharynx normal, Mucous membranes moist; Neck: Supple, Full range of motion, No lymphadenopathy; Cardiovascular: Regular rate and rhythm, No gallop; Respiratory: Breath sounds clear & equal bilaterally, No wheezes.  Speaking full sentences with ease, Normal respiratory effort/excursion; Chest: Nontender, Movement normal; Abdomen: Soft, Nontender, Nondistended, Normal bowel sounds; Genitourinary: No CVA tenderness;  Spine:  No midline CS, TS, LS tenderness. +mild TTP bilat lumbar paraspinal muscles.;; Extremities: Pulses normal, Pelvis stable. +generalized tenderness to palp bilat hips and knees. No specific area of point tenderness. NT bilat ankles/feet. +2 pedal edema bilat, No calf asymmetry.; Neuro: AA&Ox3, Major CN grossly intact.  Speech clear. No gross focal motor or sensory deficits in extremities. Strength 5/5 equal bilat UE's and LE's, including great toe dorsiflexion.  No gross sensory deficits. Pt c/o bilat legs pain while performing straight leg raises bilat.; Skin: Color normal, Warm, Dry.   ED Treatments / Results  Labs (all labs ordered are listed, but only abnormal results are displayed)   EKG  EKG Interpretation None       Radiology   Procedures Procedures (including critical care time)  Medications Ordered in ED Medications  morphine 2 MG/ML injection 2 mg (not administered)  Initial Impression / Assessment and Plan / ED Course  I have reviewed the triage vital signs and the nursing notes.  Pertinent labs & imaging results that were available during my care of the patient were reviewed by me and considered in my medical decision making (see chart for details).  MDM Reviewed: previous chart, nursing note and vitals Reviewed previous: labs Interpretation: labs, x-ray and CT scan    Results for orders placed or performed during the hospital encounter of 03/29/17  Urinalysis, Complete w Microscopic  Result Value Ref Range   Color, Urine YELLOW YELLOW   APPearance CLEAR CLEAR   Specific Gravity, Urine 1.011 1.005 - 1.030   pH 7.0 5.0 - 8.0   Glucose, UA NEGATIVE NEGATIVE mg/dL   Hgb urine dipstick NEGATIVE NEGATIVE   Bilirubin Urine NEGATIVE NEGATIVE   Ketones, ur NEGATIVE NEGATIVE mg/dL   Protein, ur NEGATIVE NEGATIVE mg/dL   Nitrite NEGATIVE NEGATIVE   Leukocytes, UA NEGATIVE NEGATIVE   RBC / HPF NONE SEEN 0 - 5 RBC/hpf   WBC, UA 0-5 0 - 5 WBC/hpf    Bacteria, UA NONE SEEN NONE SEEN   Squamous Epithelial / LPF 0-5 (A) NONE SEEN  CBC with Differential  Result Value Ref Range   WBC 5.2 4.0 - 10.5 K/uL   RBC 3.80 (L) 4.22 - 5.81 MIL/uL   Hemoglobin 9.7 (L) 13.0 - 17.0 g/dL   HCT 30.7 (L) 39.0 - 52.0 %   MCV 80.8 78.0 - 100.0 fL   MCH 25.5 (L) 26.0 - 34.0 pg   MCHC 31.6 30.0 - 36.0 g/dL   RDW 13.5 11.5 - 15.5 %   Platelets 264 150 - 400 K/uL   Neutrophils Relative % 61 %   Neutro Abs 3.1 1.7 - 7.7 K/uL   Lymphocytes Relative 21 %   Lymphs Abs 1.1 0.7 - 4.0 K/uL   Monocytes Relative 14 %   Monocytes Absolute 0.8 0.1 - 1.0 K/uL   Eosinophils Relative 4 %   Eosinophils Absolute 0.2 0.0 - 0.7 K/uL   Basophils Relative 0 %   Basophils Absolute 0.0 0.0 - 0.1 K/uL  Basic metabolic panel  Result Value Ref Range   Sodium 134 (L) 135 - 145 mmol/L   Potassium 4.0 3.5 - 5.1 mmol/L   Chloride 101 101 - 111 mmol/L   CO2 26 22 - 32 mmol/L   Glucose, Bld 97 65 - 99 mg/dL   BUN 6 6 - 20 mg/dL   Creatinine, Ser 0.53 (L) 0.61 - 1.24 mg/dL   Calcium 8.9 8.9 - 10.3 mg/dL   GFR calc non Af Amer >60 >60 mL/min   GFR calc Af Amer >60 >60 mL/min   Anion gap 7 5 - 15   Ct Lumbar Spine Wo Contrast Result Date: 03/29/2017 CLINICAL DATA:  Bilateral leg pain, low back pain. Started a couple days ago. EXAM: CT LUMBAR SPINE WITHOUT CONTRAST TECHNIQUE: Multidetector CT imaging of the lumbar spine was performed without intravenous contrast administration. Multiplanar CT image reconstructions were also generated. COMPARISON:  MR lumbar spine 06/25/2012 FINDINGS: Segmentation: 5 lumbar type vertebrae. Alignment: Grade 1 anterolisthesis of L5 on S1 secondary to severe bilateral facet arthropathy. Vertebrae: Remote vertebral body augmentation at L4-5 and L5-S1 with methylmethacrylate within the vertebral bodies from prior kyphoplasty. Remainder the vertebral body heights are maintained and are in normal anatomic alignment. No evidence of discitis or aggressive bone  lesion. Moderate osteoarthritis of bilateral sacroiliac joints. Paraspinal and other soft tissues: No paraspinal  abnormality. Abdominal aortic atherosclerosis. Disc levels: T12-L1: Mild degenerative disc disease with mild disc height loss. Mild bilateral facet arthropathy. No foraminal stenosis. L1-2: Degenerative disc disease with disc height loss and vacuum disc phenomenon. Broad-based disc osteophyte complex. Mild bilateral foraminal stenosis. Moderate bilateral facet arthropathy. L2-3: Degenerative disc disease with disc height loss and vacuum disc phenomenon. Right lateral large disc osteophyte complex. Moderate bilateral facet arthropathy. No foraminal stenosis. L3-4: Degenerative disc disease with disc height loss and vacuum disc phenomenon. L4-5: Degenerative disc disease with disc height loss with vacuum disc phenomenon. Severe bilateral facet arthropathy. No foraminal stenosis. L5-S1: Degenerative disc disease with disc height loss and vacuum disc phenomenon. Severe bilateral facet arthropathy. Severe bilateral foraminal stenosis. IMPRESSION: 1. No acute osseous injury of the lumbar spine. 2. Diffuse lumbar spine spondylosis as described above. Electronically Signed   By: Kathreen Devoid   On: 03/29/2017 15:58   Dg Knee Complete 4 Views Left Result Date: 03/29/2017 CLINICAL DATA:  Pain. EXAM: LEFT KNEE - COMPLETE 4+ VIEW COMPARISON:  None. 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 Mild degenerative changes and osteopenia. No fractures or joint effusion. No other acute abnormalities. IMPRESSION: CPPD.  Mild degenerative changes. Electronically Signed   By: Dorise Bullion III M.D   On: 03/29/2017 14:15   Dg Knee Complete 4 Views Right Result Date: 03/29/2017 CLINICAL DATA:  Pain. EXAM: RIGHT KNEE - COMPLETE 4+ VIEW COMPARISON:  None. FINDINGS: Patient is status post knee replacement. Hardware is in good position. No fracture or effusion. Possible small loose body seen on the lateral  view in the suprapatellar region. IMPRESSION: No acute abnormality. Electronically Signed   By: Dorise Bullion III M.D   On: 03/29/2017 14:17   Dg Hips Bilat With Pelvis Min 5 Views Result Date: 03/29/2017 CLINICAL DATA:  Pain. EXAM: DG HIP (WITH OR WITHOUT PELVIS) 5+V BILAT COMPARISON:  None. FINDINGS: A bony excrescence off the right iliac bone is consistent with an osteochondroma, unchanged since 2014. Severe degenerative changes in the right hip with loss of joint space and sclerosis. No fractures are seen. The patient is status post left hip replacement. A fractured inferior screw is again identified, unchanged. The acetabular component appears to be in stable position. Kyphoplasties in the lower lumbar spine. Postsurgical deformity of the left iliac wing. IMPRESSION: No acute abnormality. Electronically Signed   By: Dorise Bullion III M.D   On: 03/29/2017 14:20    2951:  Pt does not walk per baseline. States he has a Hoverround he gets around his house in, can stand to pivot. Pt was able to perform his baseline stand/pivot while her in the ED without assist. Pt feels better after pain meds. Abd remains benign, resps easy, VS per baseline per EPIC chart review. Pt states he has pain meds at home. Continue to tx symptomatically at this time. Pt strongly encouraged to f/u with PMD for good continuity of care and control of his chronic pain. Dx and testing d/w pt.  Questions answered.  Verb understanding, agreeable to d/c home with outpt f/u.    Final Clinical Impressions(s) / ED Diagnoses   Final diagnoses:  Pain    New Prescriptions New Prescriptions   No medications on file     Francine Graven, DO 04/02/17 1528

## 2017-03-29 NOTE — ED Notes (Signed)
Pt does not ambulate independently at home. Pt states he pivots from bed/chair, chair/hover round. He pivoted from bed to chair with no assist.

## 2017-03-29 NOTE — ED Notes (Addendum)
Pt  Does not have a ride home. He gave this nurse two names but no phone numbers. Jake Adams (sp) and Jake Adams. Unable to find contact numbers in phone book or on [email or web address]

## 2017-03-29 NOTE — ED Notes (Signed)
Bladder scanner 117 ml residual after 125 ml output

## 2017-03-29 NOTE — ED Triage Notes (Signed)
Pt comes in from home for bilateral leg pain and lower back pain that started "a couple" of days ago. Pt has had discolored urine. Pt has IV established and was given 30mg  Toradol in route to relieve pain. He states his pain is a little better.

## 2017-03-30 LAB — URINE CULTURE

## 2017-04-08 DIAGNOSIS — Z0289 Encounter for other administrative examinations: Secondary | ICD-10-CM | POA: Diagnosis not present

## 2017-04-08 DIAGNOSIS — R4182 Altered mental status, unspecified: Secondary | ICD-10-CM | POA: Diagnosis not present

## 2017-04-08 DIAGNOSIS — W06XXXA Fall from bed, initial encounter: Secondary | ICD-10-CM | POA: Diagnosis not present

## 2017-04-08 DIAGNOSIS — E876 Hypokalemia: Secondary | ICD-10-CM | POA: Diagnosis not present

## 2017-04-08 DIAGNOSIS — R791 Abnormal coagulation profile: Secondary | ICD-10-CM | POA: Diagnosis not present

## 2017-04-08 DIAGNOSIS — R410 Disorientation, unspecified: Secondary | ICD-10-CM | POA: Diagnosis not present

## 2017-04-08 DIAGNOSIS — E440 Moderate protein-calorie malnutrition: Secondary | ICD-10-CM | POA: Diagnosis not present

## 2017-04-08 DIAGNOSIS — D649 Anemia, unspecified: Secondary | ICD-10-CM | POA: Diagnosis not present

## 2017-04-08 DIAGNOSIS — I639 Cerebral infarction, unspecified: Secondary | ICD-10-CM | POA: Diagnosis not present

## 2017-04-08 DIAGNOSIS — Z9181 History of falling: Secondary | ICD-10-CM | POA: Diagnosis not present

## 2017-04-08 DIAGNOSIS — R531 Weakness: Secondary | ICD-10-CM | POA: Diagnosis not present

## 2017-04-08 DIAGNOSIS — R404 Transient alteration of awareness: Secondary | ICD-10-CM | POA: Diagnosis not present

## 2017-04-08 DIAGNOSIS — I10 Essential (primary) hypertension: Secondary | ICD-10-CM | POA: Diagnosis not present

## 2017-04-08 DIAGNOSIS — T84021A Dislocation of internal left hip prosthesis, initial encounter: Secondary | ICD-10-CM | POA: Diagnosis not present

## 2017-04-08 DIAGNOSIS — F0390 Unspecified dementia without behavioral disturbance: Secondary | ICD-10-CM | POA: Diagnosis not present

## 2017-04-08 DIAGNOSIS — G9341 Metabolic encephalopathy: Secondary | ICD-10-CM | POA: Diagnosis not present

## 2017-04-09 DIAGNOSIS — R531 Weakness: Secondary | ICD-10-CM | POA: Diagnosis not present

## 2017-04-09 DIAGNOSIS — R809 Proteinuria, unspecified: Secondary | ICD-10-CM | POA: Diagnosis not present

## 2017-04-09 DIAGNOSIS — D509 Iron deficiency anemia, unspecified: Secondary | ICD-10-CM | POA: Diagnosis not present

## 2017-04-09 DIAGNOSIS — R262 Difficulty in walking, not elsewhere classified: Secondary | ICD-10-CM | POA: Diagnosis not present

## 2017-04-09 DIAGNOSIS — R4182 Altered mental status, unspecified: Secondary | ICD-10-CM | POA: Diagnosis not present

## 2017-04-09 DIAGNOSIS — Z86711 Personal history of pulmonary embolism: Secondary | ICD-10-CM | POA: Diagnosis not present

## 2017-04-09 DIAGNOSIS — E785 Hyperlipidemia, unspecified: Secondary | ICD-10-CM | POA: Diagnosis present

## 2017-04-09 DIAGNOSIS — I639 Cerebral infarction, unspecified: Secondary | ICD-10-CM | POA: Diagnosis present

## 2017-04-09 DIAGNOSIS — Z7901 Long term (current) use of anticoagulants: Secondary | ICD-10-CM | POA: Diagnosis not present

## 2017-04-09 DIAGNOSIS — M25552 Pain in left hip: Secondary | ICD-10-CM | POA: Diagnosis not present

## 2017-04-09 DIAGNOSIS — F0390 Unspecified dementia without behavioral disturbance: Secondary | ICD-10-CM | POA: Diagnosis present

## 2017-04-09 DIAGNOSIS — R310 Gross hematuria: Secondary | ICD-10-CM | POA: Diagnosis not present

## 2017-04-09 DIAGNOSIS — R410 Disorientation, unspecified: Secondary | ICD-10-CM | POA: Diagnosis not present

## 2017-04-09 DIAGNOSIS — E440 Moderate protein-calorie malnutrition: Secondary | ICD-10-CM | POA: Diagnosis present

## 2017-04-09 DIAGNOSIS — H01006 Unspecified blepharitis left eye, unspecified eyelid: Secondary | ICD-10-CM | POA: Diagnosis not present

## 2017-04-09 DIAGNOSIS — N139 Obstructive and reflux uropathy, unspecified: Secondary | ICD-10-CM | POA: Diagnosis not present

## 2017-04-09 DIAGNOSIS — G9341 Metabolic encephalopathy: Secondary | ICD-10-CM | POA: Diagnosis present

## 2017-04-09 DIAGNOSIS — E876 Hypokalemia: Secondary | ICD-10-CM | POA: Diagnosis present

## 2017-04-09 DIAGNOSIS — R202 Paresthesia of skin: Secondary | ICD-10-CM | POA: Diagnosis not present

## 2017-04-09 DIAGNOSIS — S301XXD Contusion of abdominal wall, subsequent encounter: Secondary | ICD-10-CM | POA: Diagnosis not present

## 2017-04-09 DIAGNOSIS — E871 Hypo-osmolality and hyponatremia: Secondary | ICD-10-CM | POA: Diagnosis not present

## 2017-04-09 DIAGNOSIS — I636 Cerebral infarction due to cerebral venous thrombosis, nonpyogenic: Secondary | ICD-10-CM | POA: Diagnosis not present

## 2017-04-09 DIAGNOSIS — Z96651 Presence of right artificial knee joint: Secondary | ICD-10-CM | POA: Diagnosis not present

## 2017-04-09 DIAGNOSIS — Z96643 Presence of artificial hip joint, bilateral: Secondary | ICD-10-CM | POA: Diagnosis present

## 2017-04-09 DIAGNOSIS — F329 Major depressive disorder, single episode, unspecified: Secondary | ICD-10-CM | POA: Diagnosis present

## 2017-04-09 DIAGNOSIS — Z87891 Personal history of nicotine dependence: Secondary | ICD-10-CM | POA: Diagnosis not present

## 2017-04-09 DIAGNOSIS — Z87442 Personal history of urinary calculi: Secondary | ICD-10-CM | POA: Diagnosis not present

## 2017-04-09 DIAGNOSIS — T84021A Dislocation of internal left hip prosthesis, initial encounter: Secondary | ICD-10-CM | POA: Diagnosis present

## 2017-04-09 DIAGNOSIS — H04129 Dry eye syndrome of unspecified lacrimal gland: Secondary | ICD-10-CM | POA: Diagnosis not present

## 2017-04-09 DIAGNOSIS — Z79899 Other long term (current) drug therapy: Secondary | ICD-10-CM | POA: Diagnosis not present

## 2017-04-09 DIAGNOSIS — R34 Anuria and oliguria: Secondary | ICD-10-CM | POA: Diagnosis not present

## 2017-04-09 DIAGNOSIS — Z96642 Presence of left artificial hip joint: Secondary | ICD-10-CM | POA: Diagnosis not present

## 2017-04-09 DIAGNOSIS — R791 Abnormal coagulation profile: Secondary | ICD-10-CM | POA: Diagnosis not present

## 2017-04-09 DIAGNOSIS — M6281 Muscle weakness (generalized): Secondary | ICD-10-CM | POA: Diagnosis not present

## 2017-04-09 DIAGNOSIS — N390 Urinary tract infection, site not specified: Secondary | ICD-10-CM | POA: Diagnosis not present

## 2017-04-09 DIAGNOSIS — K219 Gastro-esophageal reflux disease without esophagitis: Secondary | ICD-10-CM | POA: Diagnosis present

## 2017-04-09 DIAGNOSIS — D72829 Elevated white blood cell count, unspecified: Secondary | ICD-10-CM | POA: Diagnosis not present

## 2017-04-09 DIAGNOSIS — F419 Anxiety disorder, unspecified: Secondary | ICD-10-CM | POA: Diagnosis present

## 2017-04-09 DIAGNOSIS — I10 Essential (primary) hypertension: Secondary | ICD-10-CM | POA: Diagnosis present

## 2017-04-09 DIAGNOSIS — Z9181 History of falling: Secondary | ICD-10-CM | POA: Diagnosis not present

## 2017-04-09 DIAGNOSIS — Z9889 Other specified postprocedural states: Secondary | ICD-10-CM | POA: Diagnosis not present

## 2017-04-09 DIAGNOSIS — N401 Enlarged prostate with lower urinary tract symptoms: Secondary | ICD-10-CM | POA: Diagnosis not present

## 2017-04-09 DIAGNOSIS — N400 Enlarged prostate without lower urinary tract symptoms: Secondary | ICD-10-CM | POA: Diagnosis present

## 2017-04-09 DIAGNOSIS — D649 Anemia, unspecified: Secondary | ICD-10-CM | POA: Diagnosis present

## 2017-04-09 DIAGNOSIS — Z880 Allergy status to penicillin: Secondary | ICD-10-CM | POA: Diagnosis not present

## 2017-04-09 DIAGNOSIS — F339 Major depressive disorder, recurrent, unspecified: Secondary | ICD-10-CM | POA: Diagnosis not present

## 2017-04-12 DIAGNOSIS — R791 Abnormal coagulation profile: Secondary | ICD-10-CM | POA: Diagnosis not present

## 2017-04-12 DIAGNOSIS — Z9181 History of falling: Secondary | ICD-10-CM | POA: Diagnosis not present

## 2017-04-12 DIAGNOSIS — Z86711 Personal history of pulmonary embolism: Secondary | ICD-10-CM | POA: Diagnosis not present

## 2017-04-12 DIAGNOSIS — Z96651 Presence of right artificial knee joint: Secondary | ICD-10-CM | POA: Diagnosis not present

## 2017-04-12 DIAGNOSIS — D72829 Elevated white blood cell count, unspecified: Secondary | ICD-10-CM | POA: Diagnosis not present

## 2017-04-12 DIAGNOSIS — I636 Cerebral infarction due to cerebral venous thrombosis, nonpyogenic: Secondary | ICD-10-CM | POA: Diagnosis not present

## 2017-04-12 DIAGNOSIS — E871 Hypo-osmolality and hyponatremia: Secondary | ICD-10-CM | POA: Diagnosis not present

## 2017-04-12 DIAGNOSIS — M6281 Muscle weakness (generalized): Secondary | ICD-10-CM | POA: Diagnosis not present

## 2017-04-12 DIAGNOSIS — R34 Anuria and oliguria: Secondary | ICD-10-CM | POA: Diagnosis not present

## 2017-04-12 DIAGNOSIS — D509 Iron deficiency anemia, unspecified: Secondary | ICD-10-CM | POA: Diagnosis not present

## 2017-04-12 DIAGNOSIS — F339 Major depressive disorder, recurrent, unspecified: Secondary | ICD-10-CM | POA: Diagnosis not present

## 2017-04-12 DIAGNOSIS — M1612 Unilateral primary osteoarthritis, left hip: Secondary | ICD-10-CM | POA: Diagnosis not present

## 2017-04-12 DIAGNOSIS — K219 Gastro-esophageal reflux disease without esophagitis: Secondary | ICD-10-CM | POA: Diagnosis not present

## 2017-04-12 DIAGNOSIS — H04129 Dry eye syndrome of unspecified lacrimal gland: Secondary | ICD-10-CM | POA: Diagnosis not present

## 2017-04-12 DIAGNOSIS — Z96642 Presence of left artificial hip joint: Secondary | ICD-10-CM | POA: Diagnosis not present

## 2017-04-12 DIAGNOSIS — R310 Gross hematuria: Secondary | ICD-10-CM | POA: Diagnosis not present

## 2017-04-12 DIAGNOSIS — F0390 Unspecified dementia without behavioral disturbance: Secondary | ICD-10-CM | POA: Diagnosis not present

## 2017-04-12 DIAGNOSIS — R4182 Altered mental status, unspecified: Secondary | ICD-10-CM | POA: Diagnosis not present

## 2017-04-12 DIAGNOSIS — I2699 Other pulmonary embolism without acute cor pulmonale: Secondary | ICD-10-CM | POA: Diagnosis not present

## 2017-04-12 DIAGNOSIS — R202 Paresthesia of skin: Secondary | ICD-10-CM | POA: Diagnosis not present

## 2017-04-12 DIAGNOSIS — N139 Obstructive and reflux uropathy, unspecified: Secondary | ICD-10-CM | POA: Diagnosis not present

## 2017-04-12 DIAGNOSIS — R531 Weakness: Secondary | ICD-10-CM | POA: Diagnosis not present

## 2017-04-12 DIAGNOSIS — S301XXD Contusion of abdominal wall, subsequent encounter: Secondary | ICD-10-CM | POA: Diagnosis not present

## 2017-04-12 DIAGNOSIS — F419 Anxiety disorder, unspecified: Secondary | ICD-10-CM | POA: Diagnosis not present

## 2017-04-12 DIAGNOSIS — G9341 Metabolic encephalopathy: Secondary | ICD-10-CM | POA: Diagnosis not present

## 2017-04-12 DIAGNOSIS — I10 Essential (primary) hypertension: Secondary | ICD-10-CM | POA: Diagnosis not present

## 2017-04-12 DIAGNOSIS — N390 Urinary tract infection, site not specified: Secondary | ICD-10-CM | POA: Diagnosis not present

## 2017-04-12 DIAGNOSIS — R262 Difficulty in walking, not elsewhere classified: Secondary | ICD-10-CM | POA: Diagnosis not present

## 2017-04-12 DIAGNOSIS — E785 Hyperlipidemia, unspecified: Secondary | ICD-10-CM | POA: Diagnosis not present

## 2017-04-12 DIAGNOSIS — R809 Proteinuria, unspecified: Secondary | ICD-10-CM | POA: Diagnosis not present

## 2017-04-12 DIAGNOSIS — N401 Enlarged prostate with lower urinary tract symptoms: Secondary | ICD-10-CM | POA: Diagnosis not present

## 2017-04-14 DIAGNOSIS — I2699 Other pulmonary embolism without acute cor pulmonale: Secondary | ICD-10-CM | POA: Diagnosis not present

## 2017-04-14 DIAGNOSIS — M1612 Unilateral primary osteoarthritis, left hip: Secondary | ICD-10-CM | POA: Diagnosis not present

## 2017-04-14 DIAGNOSIS — K219 Gastro-esophageal reflux disease without esophagitis: Secondary | ICD-10-CM | POA: Diagnosis not present

## 2017-04-14 DIAGNOSIS — I10 Essential (primary) hypertension: Secondary | ICD-10-CM | POA: Diagnosis not present

## 2017-04-15 DIAGNOSIS — R6520 Severe sepsis without septic shock: Secondary | ICD-10-CM | POA: Diagnosis present

## 2017-04-15 DIAGNOSIS — D649 Anemia, unspecified: Secondary | ICD-10-CM | POA: Diagnosis present

## 2017-04-15 DIAGNOSIS — E440 Moderate protein-calorie malnutrition: Secondary | ICD-10-CM | POA: Diagnosis present

## 2017-04-15 DIAGNOSIS — N400 Enlarged prostate without lower urinary tract symptoms: Secondary | ICD-10-CM | POA: Diagnosis present

## 2017-04-15 DIAGNOSIS — I2699 Other pulmonary embolism without acute cor pulmonale: Secondary | ICD-10-CM | POA: Diagnosis not present

## 2017-04-15 DIAGNOSIS — Z7901 Long term (current) use of anticoagulants: Secondary | ICD-10-CM | POA: Diagnosis not present

## 2017-04-15 DIAGNOSIS — R4182 Altered mental status, unspecified: Secondary | ICD-10-CM | POA: Diagnosis not present

## 2017-04-15 DIAGNOSIS — Y95 Nosocomial condition: Secondary | ICD-10-CM | POA: Diagnosis not present

## 2017-04-15 DIAGNOSIS — G9341 Metabolic encephalopathy: Secondary | ICD-10-CM | POA: Diagnosis present

## 2017-04-15 DIAGNOSIS — F329 Major depressive disorder, single episode, unspecified: Secondary | ICD-10-CM | POA: Diagnosis present

## 2017-04-15 DIAGNOSIS — D72829 Elevated white blood cell count, unspecified: Secondary | ICD-10-CM | POA: Diagnosis not present

## 2017-04-15 DIAGNOSIS — R310 Gross hematuria: Secondary | ICD-10-CM | POA: Diagnosis not present

## 2017-04-15 DIAGNOSIS — R410 Disorientation, unspecified: Secondary | ICD-10-CM | POA: Diagnosis not present

## 2017-04-15 DIAGNOSIS — R262 Difficulty in walking, not elsewhere classified: Secondary | ICD-10-CM | POA: Diagnosis not present

## 2017-04-15 DIAGNOSIS — Z86711 Personal history of pulmonary embolism: Secondary | ICD-10-CM | POA: Diagnosis not present

## 2017-04-15 DIAGNOSIS — R29818 Other symptoms and signs involving the nervous system: Secondary | ICD-10-CM | POA: Diagnosis not present

## 2017-04-15 DIAGNOSIS — B952 Enterococcus as the cause of diseases classified elsewhere: Secondary | ICD-10-CM | POA: Diagnosis present

## 2017-04-15 DIAGNOSIS — R339 Retention of urine, unspecified: Secondary | ICD-10-CM | POA: Diagnosis not present

## 2017-04-15 DIAGNOSIS — J188 Other pneumonia, unspecified organism: Secondary | ICD-10-CM | POA: Diagnosis not present

## 2017-04-15 DIAGNOSIS — I959 Hypotension, unspecified: Secondary | ICD-10-CM | POA: Diagnosis present

## 2017-04-15 DIAGNOSIS — N401 Enlarged prostate with lower urinary tract symptoms: Secondary | ICD-10-CM | POA: Diagnosis not present

## 2017-04-15 DIAGNOSIS — R509 Fever, unspecified: Secondary | ICD-10-CM | POA: Diagnosis not present

## 2017-04-15 DIAGNOSIS — N390 Urinary tract infection, site not specified: Secondary | ICD-10-CM | POA: Diagnosis present

## 2017-04-15 DIAGNOSIS — R0902 Hypoxemia: Secondary | ICD-10-CM | POA: Diagnosis not present

## 2017-04-15 DIAGNOSIS — F419 Anxiety disorder, unspecified: Secondary | ICD-10-CM | POA: Diagnosis present

## 2017-04-15 DIAGNOSIS — F0390 Unspecified dementia without behavioral disturbance: Secondary | ICD-10-CM | POA: Diagnosis present

## 2017-04-15 DIAGNOSIS — I4891 Unspecified atrial fibrillation: Secondary | ICD-10-CM | POA: Diagnosis not present

## 2017-04-15 DIAGNOSIS — J9601 Acute respiratory failure with hypoxia: Secondary | ICD-10-CM | POA: Diagnosis present

## 2017-04-15 DIAGNOSIS — K219 Gastro-esophageal reflux disease without esophagitis: Secondary | ICD-10-CM | POA: Diagnosis present

## 2017-04-15 DIAGNOSIS — I10 Essential (primary) hypertension: Secondary | ICD-10-CM | POA: Diagnosis present

## 2017-04-15 DIAGNOSIS — Z880 Allergy status to penicillin: Secondary | ICD-10-CM | POA: Diagnosis not present

## 2017-04-15 DIAGNOSIS — R791 Abnormal coagulation profile: Secondary | ICD-10-CM | POA: Diagnosis not present

## 2017-04-15 DIAGNOSIS — J189 Pneumonia, unspecified organism: Secondary | ICD-10-CM | POA: Diagnosis present

## 2017-04-15 DIAGNOSIS — S301XXD Contusion of abdominal wall, subsequent encounter: Secondary | ICD-10-CM | POA: Diagnosis not present

## 2017-04-15 DIAGNOSIS — B372 Candidiasis of skin and nail: Secondary | ICD-10-CM | POA: Diagnosis present

## 2017-04-15 DIAGNOSIS — M25552 Pain in left hip: Secondary | ICD-10-CM | POA: Diagnosis not present

## 2017-04-15 DIAGNOSIS — E785 Hyperlipidemia, unspecified: Secondary | ICD-10-CM | POA: Diagnosis present

## 2017-04-15 DIAGNOSIS — A419 Sepsis, unspecified organism: Secondary | ICD-10-CM | POA: Diagnosis present

## 2017-04-15 DIAGNOSIS — R5381 Other malaise: Secondary | ICD-10-CM | POA: Diagnosis not present

## 2017-04-15 DIAGNOSIS — N179 Acute kidney failure, unspecified: Secondary | ICD-10-CM | POA: Diagnosis present

## 2017-04-15 DIAGNOSIS — M6281 Muscle weakness (generalized): Secondary | ICD-10-CM | POA: Diagnosis not present

## 2017-04-15 DIAGNOSIS — T84021A Dislocation of internal left hip prosthesis, initial encounter: Secondary | ICD-10-CM | POA: Diagnosis present

## 2017-04-16 DIAGNOSIS — M25552 Pain in left hip: Secondary | ICD-10-CM | POA: Diagnosis not present

## 2017-04-16 DIAGNOSIS — I10 Essential (primary) hypertension: Secondary | ICD-10-CM | POA: Diagnosis not present

## 2017-04-16 DIAGNOSIS — R262 Difficulty in walking, not elsewhere classified: Secondary | ICD-10-CM | POA: Diagnosis not present

## 2017-04-16 DIAGNOSIS — I4891 Unspecified atrial fibrillation: Secondary | ICD-10-CM | POA: Diagnosis not present

## 2017-04-16 DIAGNOSIS — R5381 Other malaise: Secondary | ICD-10-CM | POA: Diagnosis not present

## 2017-04-17 DIAGNOSIS — R339 Retention of urine, unspecified: Secondary | ICD-10-CM | POA: Diagnosis not present

## 2017-04-17 DIAGNOSIS — N401 Enlarged prostate with lower urinary tract symptoms: Secondary | ICD-10-CM | POA: Diagnosis not present

## 2017-05-02 DIAGNOSIS — B372 Candidiasis of skin and nail: Secondary | ICD-10-CM | POA: Diagnosis present

## 2017-05-02 DIAGNOSIS — J189 Pneumonia, unspecified organism: Secondary | ICD-10-CM | POA: Diagnosis present

## 2017-05-02 DIAGNOSIS — R509 Fever, unspecified: Secondary | ICD-10-CM | POA: Diagnosis not present

## 2017-05-02 DIAGNOSIS — Z86711 Personal history of pulmonary embolism: Secondary | ICD-10-CM | POA: Diagnosis not present

## 2017-05-02 DIAGNOSIS — J188 Other pneumonia, unspecified organism: Secondary | ICD-10-CM | POA: Diagnosis not present

## 2017-05-02 DIAGNOSIS — E785 Hyperlipidemia, unspecified: Secondary | ICD-10-CM | POA: Diagnosis present

## 2017-05-02 DIAGNOSIS — K219 Gastro-esophageal reflux disease without esophagitis: Secondary | ICD-10-CM | POA: Diagnosis present

## 2017-05-02 DIAGNOSIS — Z7901 Long term (current) use of anticoagulants: Secondary | ICD-10-CM | POA: Diagnosis not present

## 2017-05-02 DIAGNOSIS — F0390 Unspecified dementia without behavioral disturbance: Secondary | ICD-10-CM | POA: Diagnosis present

## 2017-05-02 DIAGNOSIS — D649 Anemia, unspecified: Secondary | ICD-10-CM | POA: Diagnosis present

## 2017-05-02 DIAGNOSIS — R6520 Severe sepsis without septic shock: Secondary | ICD-10-CM | POA: Diagnosis present

## 2017-05-02 DIAGNOSIS — N179 Acute kidney failure, unspecified: Secondary | ICD-10-CM | POA: Diagnosis present

## 2017-05-02 DIAGNOSIS — G9341 Metabolic encephalopathy: Secondary | ICD-10-CM | POA: Diagnosis present

## 2017-05-02 DIAGNOSIS — F419 Anxiety disorder, unspecified: Secondary | ICD-10-CM | POA: Diagnosis present

## 2017-05-02 DIAGNOSIS — D72829 Elevated white blood cell count, unspecified: Secondary | ICD-10-CM | POA: Diagnosis not present

## 2017-05-02 DIAGNOSIS — N400 Enlarged prostate without lower urinary tract symptoms: Secondary | ICD-10-CM | POA: Diagnosis present

## 2017-05-02 DIAGNOSIS — F329 Major depressive disorder, single episode, unspecified: Secondary | ICD-10-CM | POA: Diagnosis present

## 2017-05-02 DIAGNOSIS — I959 Hypotension, unspecified: Secondary | ICD-10-CM | POA: Diagnosis present

## 2017-05-02 DIAGNOSIS — B952 Enterococcus as the cause of diseases classified elsewhere: Secondary | ICD-10-CM | POA: Diagnosis present

## 2017-05-02 DIAGNOSIS — N390 Urinary tract infection, site not specified: Secondary | ICD-10-CM | POA: Diagnosis not present

## 2017-05-02 DIAGNOSIS — J9601 Acute respiratory failure with hypoxia: Secondary | ICD-10-CM | POA: Diagnosis present

## 2017-05-02 DIAGNOSIS — T84021A Dislocation of internal left hip prosthesis, initial encounter: Secondary | ICD-10-CM | POA: Diagnosis present

## 2017-05-02 DIAGNOSIS — Z880 Allergy status to penicillin: Secondary | ICD-10-CM | POA: Diagnosis not present

## 2017-05-02 DIAGNOSIS — A419 Sepsis, unspecified organism: Secondary | ICD-10-CM | POA: Diagnosis not present

## 2017-05-02 DIAGNOSIS — R29818 Other symptoms and signs involving the nervous system: Secondary | ICD-10-CM | POA: Diagnosis not present

## 2017-05-02 DIAGNOSIS — Y95 Nosocomial condition: Secondary | ICD-10-CM | POA: Diagnosis not present

## 2017-05-02 DIAGNOSIS — R410 Disorientation, unspecified: Secondary | ICD-10-CM | POA: Diagnosis not present

## 2017-05-02 DIAGNOSIS — E440 Moderate protein-calorie malnutrition: Secondary | ICD-10-CM | POA: Diagnosis present

## 2017-05-02 DIAGNOSIS — R0902 Hypoxemia: Secondary | ICD-10-CM | POA: Diagnosis not present

## 2017-05-02 DIAGNOSIS — I10 Essential (primary) hypertension: Secondary | ICD-10-CM | POA: Diagnosis not present

## 2017-05-05 DIAGNOSIS — J189 Pneumonia, unspecified organism: Secondary | ICD-10-CM | POA: Diagnosis not present

## 2017-05-05 DIAGNOSIS — F0390 Unspecified dementia without behavioral disturbance: Secondary | ICD-10-CM | POA: Diagnosis not present

## 2017-05-05 DIAGNOSIS — I4891 Unspecified atrial fibrillation: Secondary | ICD-10-CM | POA: Diagnosis not present

## 2017-05-05 DIAGNOSIS — R410 Disorientation, unspecified: Secondary | ICD-10-CM | POA: Diagnosis not present

## 2017-05-05 DIAGNOSIS — A419 Sepsis, unspecified organism: Secondary | ICD-10-CM | POA: Diagnosis not present

## 2017-05-05 DIAGNOSIS — H6123 Impacted cerumen, bilateral: Secondary | ICD-10-CM | POA: Diagnosis not present

## 2017-05-05 DIAGNOSIS — F068 Other specified mental disorders due to known physiological condition: Secondary | ICD-10-CM | POA: Diagnosis not present

## 2017-05-05 DIAGNOSIS — N390 Urinary tract infection, site not specified: Secondary | ICD-10-CM | POA: Diagnosis not present

## 2017-05-05 DIAGNOSIS — G9341 Metabolic encephalopathy: Secondary | ICD-10-CM | POA: Diagnosis not present

## 2017-05-06 DIAGNOSIS — F068 Other specified mental disorders due to known physiological condition: Secondary | ICD-10-CM | POA: Diagnosis not present

## 2017-05-06 DIAGNOSIS — J189 Pneumonia, unspecified organism: Secondary | ICD-10-CM | POA: Diagnosis not present

## 2017-05-06 DIAGNOSIS — I4891 Unspecified atrial fibrillation: Secondary | ICD-10-CM | POA: Diagnosis not present

## 2017-05-06 DIAGNOSIS — N390 Urinary tract infection, site not specified: Secondary | ICD-10-CM | POA: Diagnosis not present

## 2017-05-28 DIAGNOSIS — H6123 Impacted cerumen, bilateral: Secondary | ICD-10-CM | POA: Diagnosis not present

## 2017-06-03 DIAGNOSIS — I4891 Unspecified atrial fibrillation: Secondary | ICD-10-CM | POA: Diagnosis not present

## 2017-06-03 DIAGNOSIS — N390 Urinary tract infection, site not specified: Secondary | ICD-10-CM | POA: Diagnosis not present

## 2017-06-03 DIAGNOSIS — J189 Pneumonia, unspecified organism: Secondary | ICD-10-CM | POA: Diagnosis not present

## 2017-06-03 DIAGNOSIS — F068 Other specified mental disorders due to known physiological condition: Secondary | ICD-10-CM | POA: Diagnosis not present

## 2017-06-11 ENCOUNTER — Telehealth: Payer: Self-pay | Admitting: *Deleted

## 2017-06-11 NOTE — Patient Outreach (Signed)
[facility]) Care Management  06/11/2017  KAMAURY CUTBIRTH [birth year] [id number]  Care coordination [facility] CM made call attempt to follow up with Mr Czaplicki but there was no answer nor availability to leave a voice message Mr Dolberry has not been hospitalized in the last 6 months and his last [facility] ED visit was in May 2018. No further calls to [facility] 24 hour nurse line During last contact with Mr Foss he was denying assistance with community resources and continued to bee seen by his friends, was familiar with his county transportation services and was awaiting assistance from his son to get his hoveround fix.  Denied assist from [facility] CM.  Plans call attempts to follow up, letter or closure if no response   Kimberly L. Lavina Hamman, RN, BSN, [facility] Care Management [phone]

## 2017-06-20 DIAGNOSIS — N390 Urinary tract infection, site not specified: Secondary | ICD-10-CM | POA: Diagnosis not present

## 2017-06-20 DIAGNOSIS — I4891 Unspecified atrial fibrillation: Secondary | ICD-10-CM | POA: Diagnosis not present

## 2017-06-20 DIAGNOSIS — J189 Pneumonia, unspecified organism: Secondary | ICD-10-CM | POA: Diagnosis not present

## 2017-06-20 DIAGNOSIS — F068 Other specified mental disorders due to known physiological condition: Secondary | ICD-10-CM | POA: Diagnosis not present

## 2017-07-04 DIAGNOSIS — I4891 Unspecified atrial fibrillation: Secondary | ICD-10-CM | POA: Diagnosis not present

## 2017-07-04 DIAGNOSIS — F331 Major depressive disorder, recurrent, moderate: Secondary | ICD-10-CM | POA: Diagnosis not present

## 2017-07-04 DIAGNOSIS — F068 Other specified mental disorders due to known physiological condition: Secondary | ICD-10-CM | POA: Diagnosis not present

## 2017-07-04 DIAGNOSIS — N390 Urinary tract infection, site not specified: Secondary | ICD-10-CM | POA: Diagnosis not present

## 2017-07-04 DIAGNOSIS — J189 Pneumonia, unspecified organism: Secondary | ICD-10-CM | POA: Diagnosis not present

## 2017-07-18 DIAGNOSIS — F331 Major depressive disorder, recurrent, moderate: Secondary | ICD-10-CM | POA: Diagnosis not present

## 2017-07-18 DIAGNOSIS — F068 Other specified mental disorders due to known physiological condition: Secondary | ICD-10-CM | POA: Diagnosis not present

## 2017-07-18 DIAGNOSIS — J189 Pneumonia, unspecified organism: Secondary | ICD-10-CM | POA: Diagnosis not present

## 2017-07-18 DIAGNOSIS — I4891 Unspecified atrial fibrillation: Secondary | ICD-10-CM | POA: Diagnosis not present

## 2017-07-18 DIAGNOSIS — N390 Urinary tract infection, site not specified: Secondary | ICD-10-CM | POA: Diagnosis not present

## 2017-07-27 DIAGNOSIS — I10 Essential (primary) hypertension: Secondary | ICD-10-CM | POA: Diagnosis not present

## 2017-07-27 DIAGNOSIS — G9341 Metabolic encephalopathy: Secondary | ICD-10-CM | POA: Diagnosis not present

## 2017-07-27 DIAGNOSIS — T84021D Dislocation of internal left hip prosthesis, subsequent encounter: Secondary | ICD-10-CM | POA: Diagnosis not present

## 2017-07-27 DIAGNOSIS — R791 Abnormal coagulation profile: Secondary | ICD-10-CM | POA: Diagnosis not present

## 2017-07-27 DIAGNOSIS — N390 Urinary tract infection, site not specified: Secondary | ICD-10-CM | POA: Diagnosis not present

## 2017-08-01 DIAGNOSIS — R2681 Unsteadiness on feet: Secondary | ICD-10-CM | POA: Diagnosis not present

## 2017-08-01 DIAGNOSIS — R2689 Other abnormalities of gait and mobility: Secondary | ICD-10-CM | POA: Diagnosis not present

## 2017-08-04 DIAGNOSIS — R2689 Other abnormalities of gait and mobility: Secondary | ICD-10-CM | POA: Diagnosis not present

## 2017-08-05 DIAGNOSIS — R2689 Other abnormalities of gait and mobility: Secondary | ICD-10-CM | POA: Diagnosis not present

## 2017-08-06 DIAGNOSIS — R2689 Other abnormalities of gait and mobility: Secondary | ICD-10-CM | POA: Diagnosis not present

## 2017-08-07 DIAGNOSIS — R2689 Other abnormalities of gait and mobility: Secondary | ICD-10-CM | POA: Diagnosis not present

## 2017-08-08 DIAGNOSIS — R2689 Other abnormalities of gait and mobility: Secondary | ICD-10-CM | POA: Diagnosis not present

## 2017-08-11 DIAGNOSIS — R2689 Other abnormalities of gait and mobility: Secondary | ICD-10-CM | POA: Diagnosis not present

## 2017-08-12 DIAGNOSIS — R2689 Other abnormalities of gait and mobility: Secondary | ICD-10-CM | POA: Diagnosis not present

## 2017-08-13 DIAGNOSIS — R2689 Other abnormalities of gait and mobility: Secondary | ICD-10-CM | POA: Diagnosis not present

## 2017-08-14 DIAGNOSIS — R2689 Other abnormalities of gait and mobility: Secondary | ICD-10-CM | POA: Diagnosis not present

## 2017-08-15 DIAGNOSIS — R2689 Other abnormalities of gait and mobility: Secondary | ICD-10-CM | POA: Diagnosis not present

## 2017-08-15 DIAGNOSIS — G4700 Insomnia, unspecified: Secondary | ICD-10-CM | POA: Diagnosis not present

## 2017-08-15 DIAGNOSIS — F331 Major depressive disorder, recurrent, moderate: Secondary | ICD-10-CM | POA: Diagnosis not present

## 2017-08-18 DIAGNOSIS — R2689 Other abnormalities of gait and mobility: Secondary | ICD-10-CM | POA: Diagnosis not present

## 2017-08-19 DIAGNOSIS — R2689 Other abnormalities of gait and mobility: Secondary | ICD-10-CM | POA: Diagnosis not present

## 2017-08-20 DIAGNOSIS — R2689 Other abnormalities of gait and mobility: Secondary | ICD-10-CM | POA: Diagnosis not present

## 2017-08-21 DIAGNOSIS — R2689 Other abnormalities of gait and mobility: Secondary | ICD-10-CM | POA: Diagnosis not present

## 2017-08-22 DIAGNOSIS — R2689 Other abnormalities of gait and mobility: Secondary | ICD-10-CM | POA: Diagnosis not present

## 2017-08-25 DIAGNOSIS — R2689 Other abnormalities of gait and mobility: Secondary | ICD-10-CM | POA: Diagnosis not present

## 2017-08-26 DIAGNOSIS — G4700 Insomnia, unspecified: Secondary | ICD-10-CM | POA: Diagnosis not present

## 2017-08-26 DIAGNOSIS — F331 Major depressive disorder, recurrent, moderate: Secondary | ICD-10-CM | POA: Diagnosis not present

## 2017-08-26 DIAGNOSIS — R2689 Other abnormalities of gait and mobility: Secondary | ICD-10-CM | POA: Diagnosis not present

## 2017-08-27 ENCOUNTER — Telehealth: Payer: Self-pay | Admitting: *Deleted

## 2017-08-27 DIAGNOSIS — R2689 Other abnormalities of gait and mobility: Secondary | ICD-10-CM | POA: Diagnosis not present

## 2017-08-27 NOTE — Patient Outreach (Signed)
[facility]) Care Management  08/27/2017  Jake Adams 1941/09/27 [id number]   Case closure No responses from [name]  Letter sent to him and his pcp  [facility] CMA updated for closure No other [facility] discipline following at this time All goals met   Plan case closure  Letters to pcp and patient  Joelene Millin L. Lavina Hamman, RN, BSN, [place] Care Management [phone]

## 2017-08-28 DIAGNOSIS — R2689 Other abnormalities of gait and mobility: Secondary | ICD-10-CM | POA: Diagnosis not present

## 2017-08-29 DIAGNOSIS — R2689 Other abnormalities of gait and mobility: Secondary | ICD-10-CM | POA: Diagnosis not present

## 2017-09-01 DIAGNOSIS — R2689 Other abnormalities of gait and mobility: Secondary | ICD-10-CM | POA: Diagnosis not present

## 2017-09-02 DIAGNOSIS — R2689 Other abnormalities of gait and mobility: Secondary | ICD-10-CM | POA: Diagnosis not present

## 2017-09-03 DIAGNOSIS — R2689 Other abnormalities of gait and mobility: Secondary | ICD-10-CM | POA: Diagnosis not present

## 2017-09-04 DIAGNOSIS — R2689 Other abnormalities of gait and mobility: Secondary | ICD-10-CM | POA: Diagnosis not present

## 2017-09-05 DIAGNOSIS — R2689 Other abnormalities of gait and mobility: Secondary | ICD-10-CM | POA: Diagnosis not present

## 2017-09-07 DIAGNOSIS — G894 Chronic pain syndrome: Secondary | ICD-10-CM | POA: Diagnosis not present

## 2017-09-07 DIAGNOSIS — N3941 Urge incontinence: Secondary | ICD-10-CM | POA: Diagnosis not present

## 2017-09-07 DIAGNOSIS — N401 Enlarged prostate with lower urinary tract symptoms: Secondary | ICD-10-CM | POA: Diagnosis not present

## 2017-09-08 DIAGNOSIS — R2689 Other abnormalities of gait and mobility: Secondary | ICD-10-CM | POA: Diagnosis not present

## 2017-09-08 DIAGNOSIS — N390 Urinary tract infection, site not specified: Secondary | ICD-10-CM | POA: Diagnosis not present

## 2017-09-09 DIAGNOSIS — R2689 Other abnormalities of gait and mobility: Secondary | ICD-10-CM | POA: Diagnosis not present

## 2017-09-10 DIAGNOSIS — R2689 Other abnormalities of gait and mobility: Secondary | ICD-10-CM | POA: Diagnosis not present

## 2017-09-11 DIAGNOSIS — R2689 Other abnormalities of gait and mobility: Secondary | ICD-10-CM | POA: Diagnosis not present

## 2017-09-12 DIAGNOSIS — R2689 Other abnormalities of gait and mobility: Secondary | ICD-10-CM | POA: Diagnosis not present

## 2017-09-15 DIAGNOSIS — R2689 Other abnormalities of gait and mobility: Secondary | ICD-10-CM | POA: Diagnosis not present

## 2017-09-15 DIAGNOSIS — F331 Major depressive disorder, recurrent, moderate: Secondary | ICD-10-CM | POA: Diagnosis not present

## 2017-09-15 DIAGNOSIS — G4700 Insomnia, unspecified: Secondary | ICD-10-CM | POA: Diagnosis not present

## 2017-09-15 DIAGNOSIS — F0391 Unspecified dementia with behavioral disturbance: Secondary | ICD-10-CM | POA: Diagnosis not present

## 2017-09-15 IMAGING — DX DG KNEE COMPLETE 4+V*L*
4 series · 4 of 4 positions shown · non-contrast
Comparison: None.

CLINICAL DATA: Pain.

EXAM:
LEFT KNEE - COMPLETE 4+ VIEW

[knee ap]
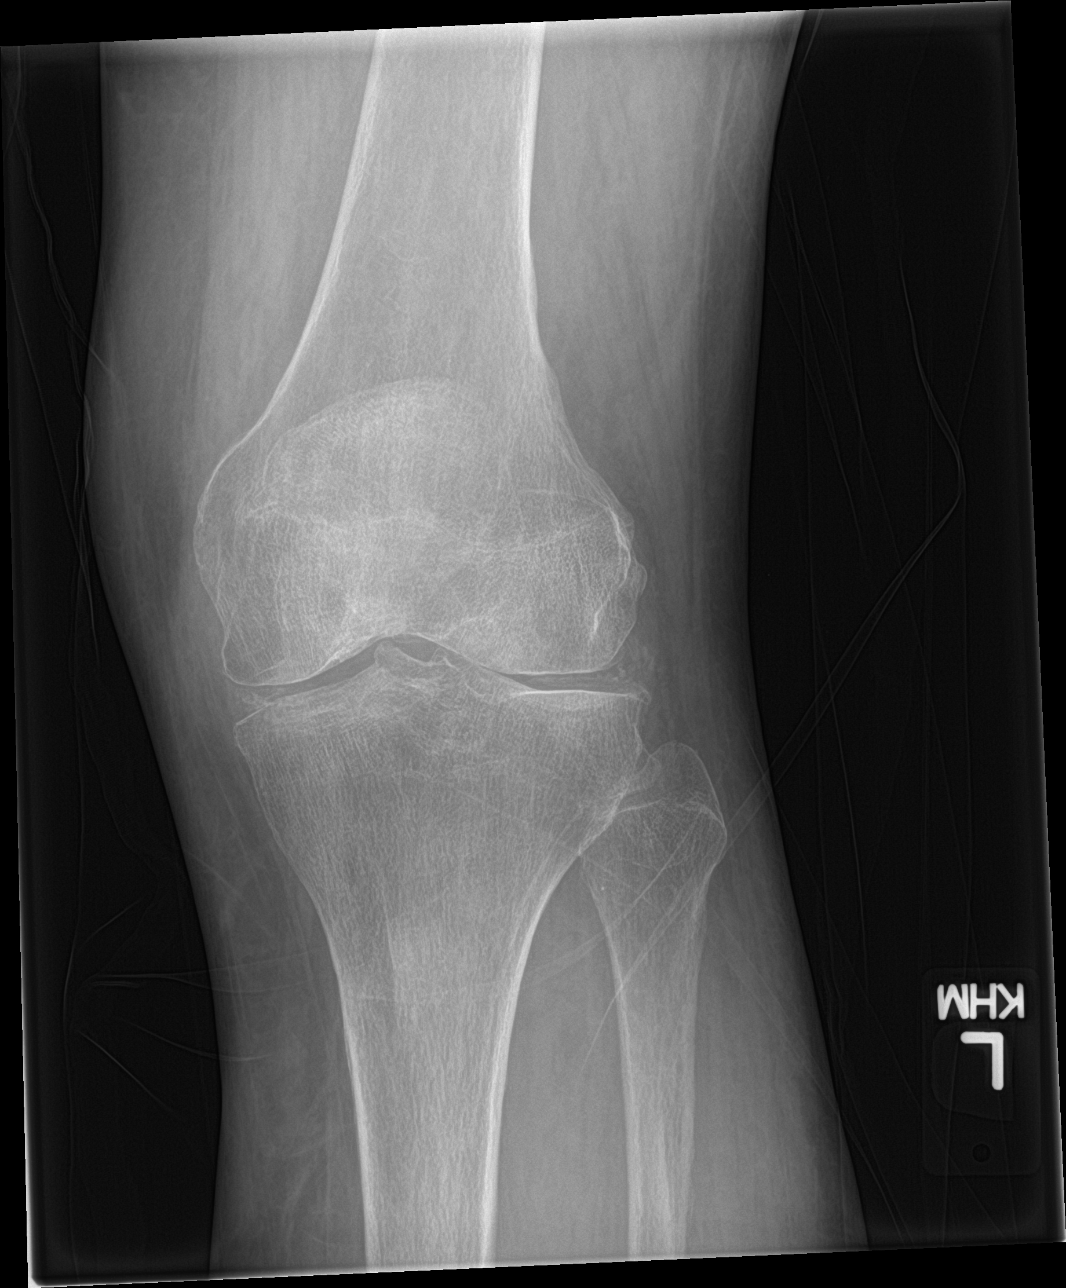

[tunnel]
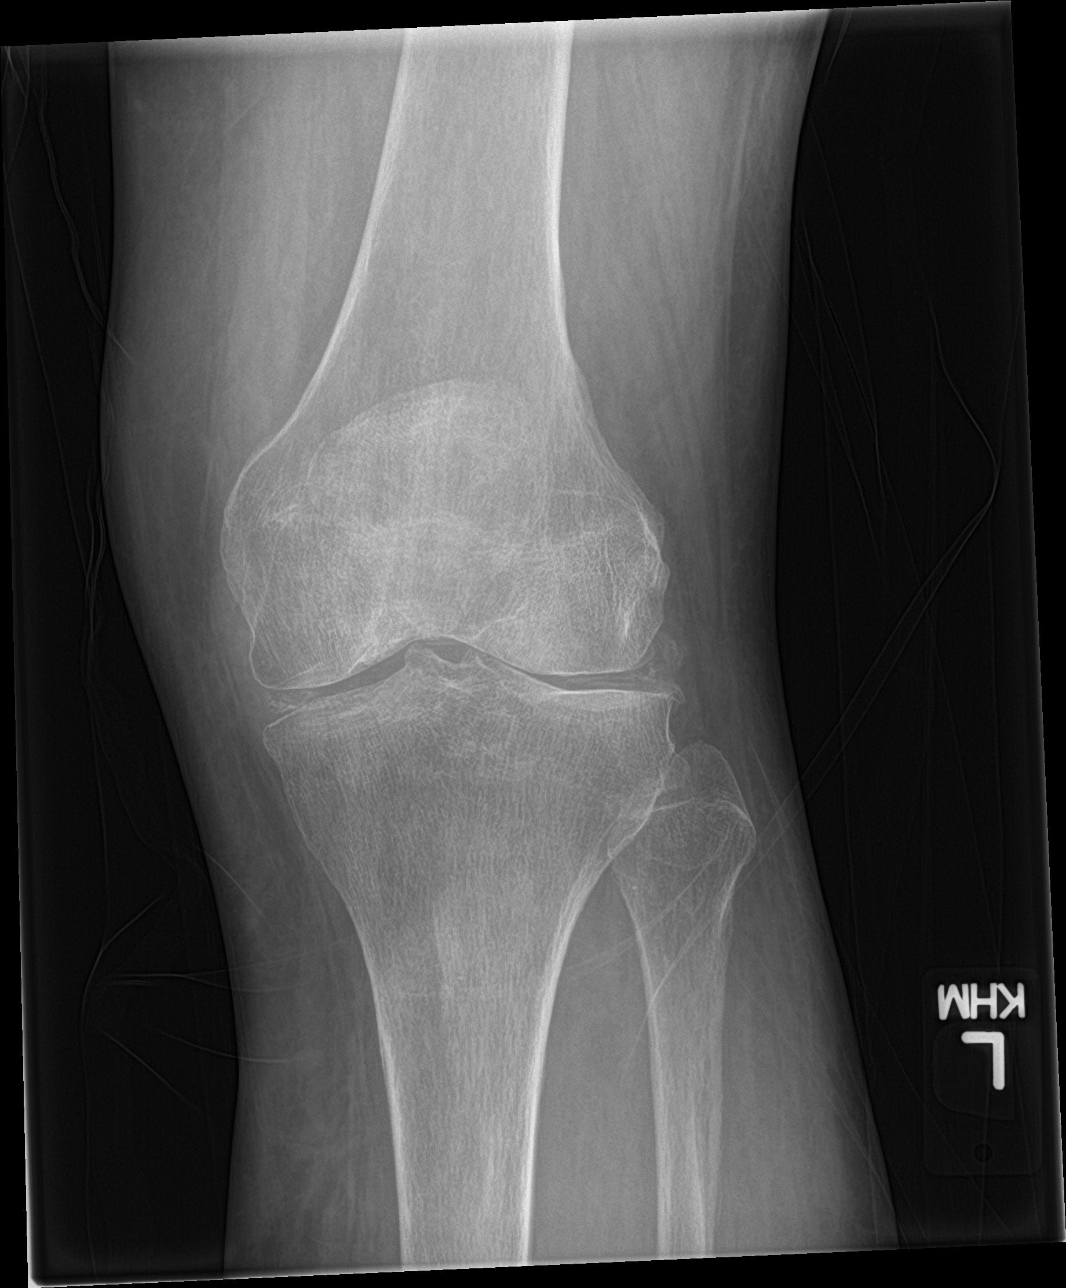

[knee lat]
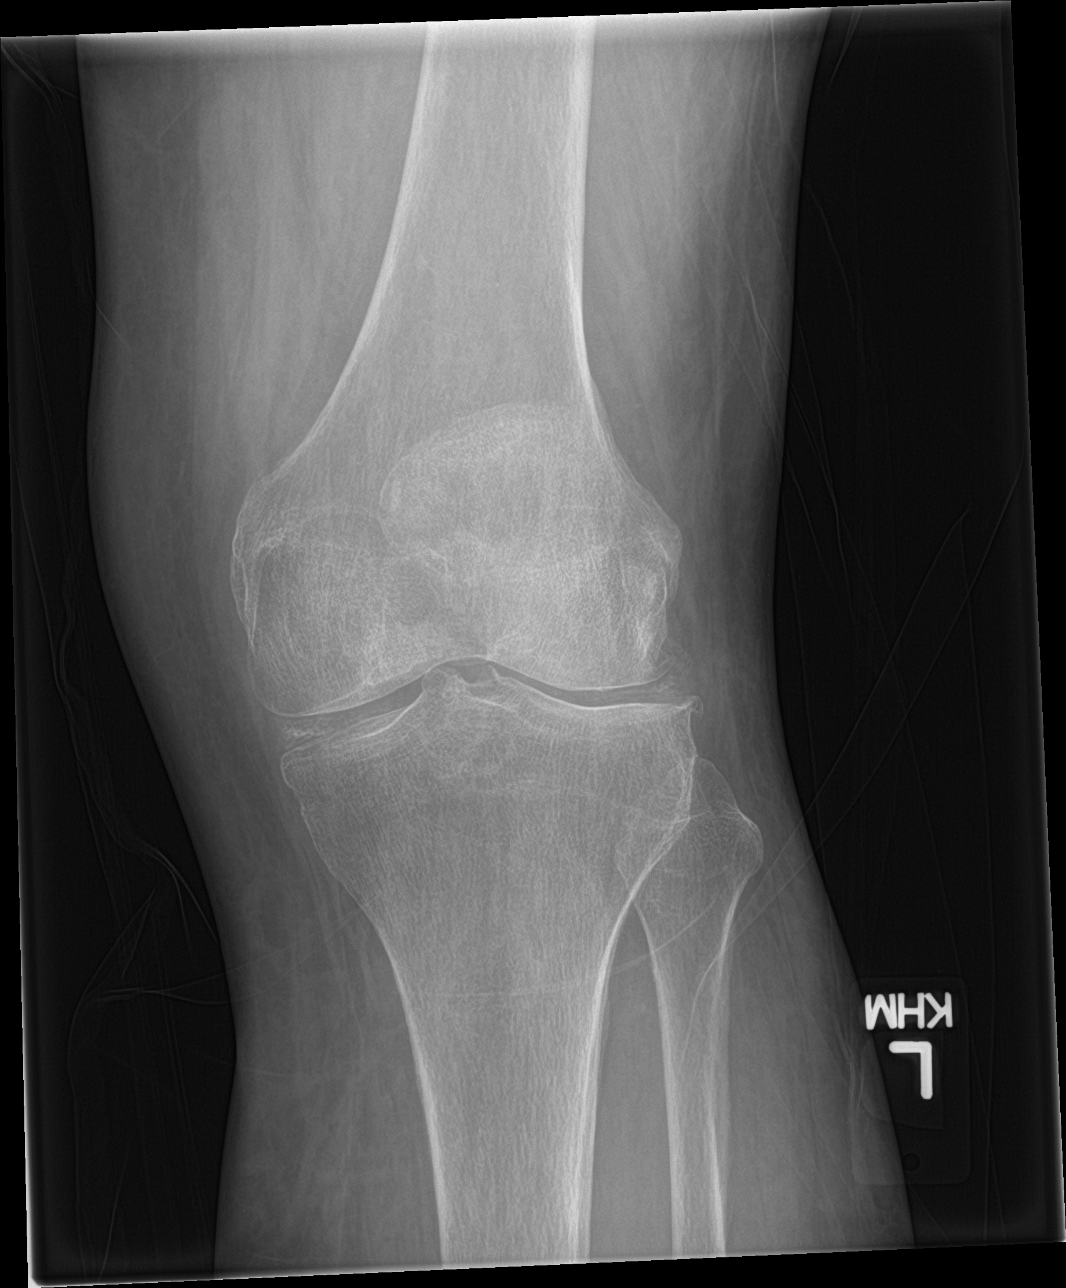

[knee sunrise]
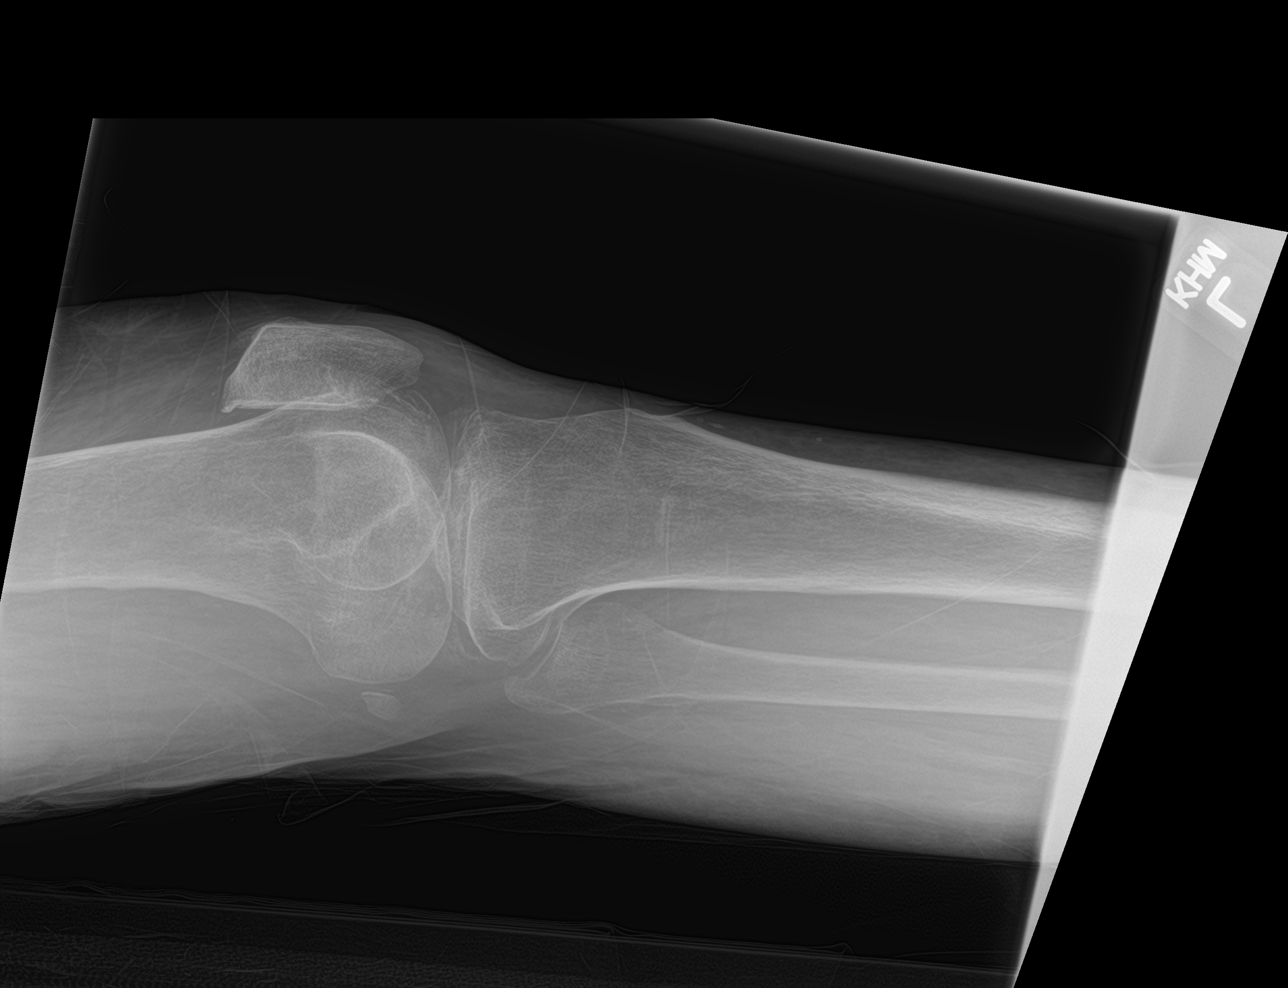

[4 of 4 positions shown; findings below may reference images not displayed]

FINDINGS: Chondrocalcinosis consistent with CPPD is identified. Mild
degenerative changes and osteopenia. No fractures or joint effusion.
No other acute abnormalities.
IMPRESSION: CPPD.  Mild degenerative changes.

## 2017-09-16 DIAGNOSIS — R2689 Other abnormalities of gait and mobility: Secondary | ICD-10-CM | POA: Diagnosis not present

## 2017-09-17 DIAGNOSIS — R2689 Other abnormalities of gait and mobility: Secondary | ICD-10-CM | POA: Diagnosis not present

## 2017-09-18 DIAGNOSIS — G894 Chronic pain syndrome: Secondary | ICD-10-CM | POA: Diagnosis not present

## 2017-09-18 DIAGNOSIS — R2689 Other abnormalities of gait and mobility: Secondary | ICD-10-CM | POA: Diagnosis not present

## 2017-09-18 DIAGNOSIS — N401 Enlarged prostate with lower urinary tract symptoms: Secondary | ICD-10-CM | POA: Diagnosis not present

## 2017-09-18 DIAGNOSIS — N3941 Urge incontinence: Secondary | ICD-10-CM | POA: Diagnosis not present

## 2017-09-19 DIAGNOSIS — R2689 Other abnormalities of gait and mobility: Secondary | ICD-10-CM | POA: Diagnosis not present

## 2017-09-19 DIAGNOSIS — I482 Chronic atrial fibrillation: Secondary | ICD-10-CM | POA: Diagnosis not present

## 2017-09-19 DIAGNOSIS — I4891 Unspecified atrial fibrillation: Secondary | ICD-10-CM | POA: Diagnosis not present

## 2017-09-23 DIAGNOSIS — Z961 Presence of intraocular lens: Secondary | ICD-10-CM | POA: Diagnosis not present

## 2017-09-23 DIAGNOSIS — H04121 Dry eye syndrome of right lacrimal gland: Secondary | ICD-10-CM | POA: Diagnosis not present

## 2017-09-23 DIAGNOSIS — H43813 Vitreous degeneration, bilateral: Secondary | ICD-10-CM | POA: Diagnosis not present

## 2017-09-23 DIAGNOSIS — H353131 Nonexudative age-related macular degeneration, bilateral, early dry stage: Secondary | ICD-10-CM | POA: Diagnosis not present

## 2017-10-16 DIAGNOSIS — F0391 Unspecified dementia with behavioral disturbance: Secondary | ICD-10-CM | POA: Diagnosis not present

## 2017-10-16 DIAGNOSIS — F331 Major depressive disorder, recurrent, moderate: Secondary | ICD-10-CM | POA: Diagnosis not present

## 2017-10-16 DIAGNOSIS — G4700 Insomnia, unspecified: Secondary | ICD-10-CM | POA: Diagnosis not present

## 2017-10-31 DIAGNOSIS — F0391 Unspecified dementia with behavioral disturbance: Secondary | ICD-10-CM | POA: Diagnosis not present

## 2017-10-31 DIAGNOSIS — F331 Major depressive disorder, recurrent, moderate: Secondary | ICD-10-CM | POA: Diagnosis not present

## 2017-10-31 DIAGNOSIS — G4700 Insomnia, unspecified: Secondary | ICD-10-CM | POA: Diagnosis not present

## 2017-11-07 DIAGNOSIS — G894 Chronic pain syndrome: Secondary | ICD-10-CM | POA: Diagnosis not present

## 2017-11-07 DIAGNOSIS — N401 Enlarged prostate with lower urinary tract symptoms: Secondary | ICD-10-CM | POA: Diagnosis not present

## 2017-11-07 DIAGNOSIS — N3941 Urge incontinence: Secondary | ICD-10-CM | POA: Diagnosis not present

## 2017-11-10 DIAGNOSIS — D649 Anemia, unspecified: Secondary | ICD-10-CM | POA: Diagnosis not present

## 2017-11-10 DIAGNOSIS — I10 Essential (primary) hypertension: Secondary | ICD-10-CM | POA: Diagnosis not present

## 2017-11-13 DIAGNOSIS — G4700 Insomnia, unspecified: Secondary | ICD-10-CM | POA: Diagnosis not present

## 2017-11-13 DIAGNOSIS — F0391 Unspecified dementia with behavioral disturbance: Secondary | ICD-10-CM | POA: Diagnosis not present

## 2017-11-13 DIAGNOSIS — F331 Major depressive disorder, recurrent, moderate: Secondary | ICD-10-CM | POA: Diagnosis not present

## 2017-12-15 DIAGNOSIS — G4700 Insomnia, unspecified: Secondary | ICD-10-CM | POA: Diagnosis not present

## 2017-12-15 DIAGNOSIS — F0391 Unspecified dementia with behavioral disturbance: Secondary | ICD-10-CM | POA: Diagnosis not present

## 2017-12-15 DIAGNOSIS — F331 Major depressive disorder, recurrent, moderate: Secondary | ICD-10-CM | POA: Diagnosis not present

## 2018-01-13 DIAGNOSIS — G4700 Insomnia, unspecified: Secondary | ICD-10-CM | POA: Diagnosis not present

## 2018-01-13 DIAGNOSIS — F0391 Unspecified dementia with behavioral disturbance: Secondary | ICD-10-CM | POA: Diagnosis not present

## 2018-01-13 DIAGNOSIS — F331 Major depressive disorder, recurrent, moderate: Secondary | ICD-10-CM | POA: Diagnosis not present

## 2018-01-16 DIAGNOSIS — I4891 Unspecified atrial fibrillation: Secondary | ICD-10-CM | POA: Diagnosis not present

## 2018-01-16 DIAGNOSIS — F068 Other specified mental disorders due to known physiological condition: Secondary | ICD-10-CM | POA: Diagnosis not present

## 2018-01-16 DIAGNOSIS — R5381 Other malaise: Secondary | ICD-10-CM | POA: Diagnosis not present

## 2018-01-16 DIAGNOSIS — N3941 Urge incontinence: Secondary | ICD-10-CM | POA: Diagnosis not present

## 2018-01-27 DIAGNOSIS — G4700 Insomnia, unspecified: Secondary | ICD-10-CM | POA: Diagnosis not present

## 2018-01-27 DIAGNOSIS — F331 Major depressive disorder, recurrent, moderate: Secondary | ICD-10-CM | POA: Diagnosis not present

## 2018-02-02 DIAGNOSIS — F331 Major depressive disorder, recurrent, moderate: Secondary | ICD-10-CM | POA: Diagnosis not present

## 2018-02-02 DIAGNOSIS — G4700 Insomnia, unspecified: Secondary | ICD-10-CM | POA: Diagnosis not present

## 2018-02-02 DIAGNOSIS — F0391 Unspecified dementia with behavioral disturbance: Secondary | ICD-10-CM | POA: Diagnosis not present

## 2018-02-10 DIAGNOSIS — F331 Major depressive disorder, recurrent, moderate: Secondary | ICD-10-CM | POA: Diagnosis not present

## 2018-02-10 DIAGNOSIS — F0391 Unspecified dementia with behavioral disturbance: Secondary | ICD-10-CM | POA: Diagnosis not present

## 2018-02-16 DIAGNOSIS — H43813 Vitreous degeneration, bilateral: Secondary | ICD-10-CM | POA: Diagnosis not present

## 2018-02-16 DIAGNOSIS — H353131 Nonexudative age-related macular degeneration, bilateral, early dry stage: Secondary | ICD-10-CM | POA: Diagnosis not present

## 2018-02-16 DIAGNOSIS — Z961 Presence of intraocular lens: Secondary | ICD-10-CM | POA: Diagnosis not present

## 2018-02-16 DIAGNOSIS — H04121 Dry eye syndrome of right lacrimal gland: Secondary | ICD-10-CM | POA: Diagnosis not present

## 2018-02-18 DIAGNOSIS — F0391 Unspecified dementia with behavioral disturbance: Secondary | ICD-10-CM | POA: Diagnosis not present

## 2018-02-18 DIAGNOSIS — F331 Major depressive disorder, recurrent, moderate: Secondary | ICD-10-CM | POA: Diagnosis not present

## 2018-03-04 DIAGNOSIS — F0391 Unspecified dementia with behavioral disturbance: Secondary | ICD-10-CM | POA: Diagnosis not present

## 2018-03-04 DIAGNOSIS — F331 Major depressive disorder, recurrent, moderate: Secondary | ICD-10-CM | POA: Diagnosis not present

## 2018-03-19 DIAGNOSIS — F0391 Unspecified dementia with behavioral disturbance: Secondary | ICD-10-CM | POA: Diagnosis not present

## 2018-03-19 DIAGNOSIS — F331 Major depressive disorder, recurrent, moderate: Secondary | ICD-10-CM | POA: Diagnosis not present

## 2018-03-20 DIAGNOSIS — R319 Hematuria, unspecified: Secondary | ICD-10-CM | POA: Diagnosis not present

## 2018-03-20 DIAGNOSIS — Z79899 Other long term (current) drug therapy: Secondary | ICD-10-CM | POA: Diagnosis not present

## 2018-03-20 DIAGNOSIS — N390 Urinary tract infection, site not specified: Secondary | ICD-10-CM | POA: Diagnosis not present

## 2018-03-20 DIAGNOSIS — R509 Fever, unspecified: Secondary | ICD-10-CM | POA: Diagnosis not present

## 2018-03-20 DIAGNOSIS — D649 Anemia, unspecified: Secondary | ICD-10-CM | POA: Diagnosis not present

## 2018-03-26 DIAGNOSIS — F0391 Unspecified dementia with behavioral disturbance: Secondary | ICD-10-CM | POA: Diagnosis not present

## 2018-03-26 DIAGNOSIS — F331 Major depressive disorder, recurrent, moderate: Secondary | ICD-10-CM | POA: Diagnosis not present

## 2018-03-28 DIAGNOSIS — I4891 Unspecified atrial fibrillation: Secondary | ICD-10-CM | POA: Diagnosis not present

## 2018-03-28 DIAGNOSIS — F068 Other specified mental disorders due to known physiological condition: Secondary | ICD-10-CM | POA: Diagnosis not present

## 2018-03-28 DIAGNOSIS — R5381 Other malaise: Secondary | ICD-10-CM | POA: Diagnosis not present

## 2018-03-28 DIAGNOSIS — N3941 Urge incontinence: Secondary | ICD-10-CM | POA: Diagnosis not present

## 2018-04-09 DIAGNOSIS — F0391 Unspecified dementia with behavioral disturbance: Secondary | ICD-10-CM | POA: Diagnosis not present

## 2018-04-09 DIAGNOSIS — G4720 Circadian rhythm sleep disorder, unspecified type: Secondary | ICD-10-CM | POA: Diagnosis not present

## 2018-04-09 DIAGNOSIS — F331 Major depressive disorder, recurrent, moderate: Secondary | ICD-10-CM | POA: Diagnosis not present

## 2018-04-16 DIAGNOSIS — F331 Major depressive disorder, recurrent, moderate: Secondary | ICD-10-CM | POA: Diagnosis not present

## 2018-04-16 DIAGNOSIS — G4720 Circadian rhythm sleep disorder, unspecified type: Secondary | ICD-10-CM | POA: Diagnosis not present

## 2018-04-16 DIAGNOSIS — F0391 Unspecified dementia with behavioral disturbance: Secondary | ICD-10-CM | POA: Diagnosis not present

## 2018-04-17 DIAGNOSIS — R531 Weakness: Secondary | ICD-10-CM | POA: Diagnosis not present

## 2018-04-17 DIAGNOSIS — F0280 Dementia in other diseases classified elsewhere without behavioral disturbance: Secondary | ICD-10-CM | POA: Diagnosis not present

## 2018-04-17 DIAGNOSIS — R45851 Suicidal ideations: Secondary | ICD-10-CM | POA: Diagnosis not present

## 2018-04-17 DIAGNOSIS — Z87891 Personal history of nicotine dependence: Secondary | ICD-10-CM | POA: Diagnosis not present

## 2018-04-17 DIAGNOSIS — Z880 Allergy status to penicillin: Secondary | ICD-10-CM | POA: Diagnosis not present

## 2018-04-17 DIAGNOSIS — F0391 Unspecified dementia with behavioral disturbance: Secondary | ICD-10-CM | POA: Diagnosis not present

## 2018-04-17 DIAGNOSIS — F0390 Unspecified dementia without behavioral disturbance: Secondary | ICD-10-CM | POA: Diagnosis not present

## 2018-04-17 DIAGNOSIS — F331 Major depressive disorder, recurrent, moderate: Secondary | ICD-10-CM | POA: Diagnosis not present

## 2018-04-17 DIAGNOSIS — R4182 Altered mental status, unspecified: Secondary | ICD-10-CM | POA: Diagnosis not present

## 2018-04-17 DIAGNOSIS — G4720 Circadian rhythm sleep disorder, unspecified type: Secondary | ICD-10-CM | POA: Diagnosis not present

## 2018-04-18 DIAGNOSIS — R4182 Altered mental status, unspecified: Secondary | ICD-10-CM | POA: Diagnosis not present

## 2018-04-18 DIAGNOSIS — F0391 Unspecified dementia with behavioral disturbance: Secondary | ICD-10-CM | POA: Diagnosis not present

## 2018-04-20 DIAGNOSIS — F331 Major depressive disorder, recurrent, moderate: Secondary | ICD-10-CM | POA: Diagnosis not present

## 2018-04-20 DIAGNOSIS — F0391 Unspecified dementia with behavioral disturbance: Secondary | ICD-10-CM | POA: Diagnosis not present

## 2018-04-20 DIAGNOSIS — G4720 Circadian rhythm sleep disorder, unspecified type: Secondary | ICD-10-CM | POA: Diagnosis not present

## 2018-04-21 DIAGNOSIS — F331 Major depressive disorder, recurrent, moderate: Secondary | ICD-10-CM | POA: Diagnosis not present

## 2018-04-21 DIAGNOSIS — F0391 Unspecified dementia with behavioral disturbance: Secondary | ICD-10-CM | POA: Diagnosis not present

## 2018-04-27 DIAGNOSIS — F0391 Unspecified dementia with behavioral disturbance: Secondary | ICD-10-CM | POA: Diagnosis not present

## 2018-04-27 DIAGNOSIS — F331 Major depressive disorder, recurrent, moderate: Secondary | ICD-10-CM | POA: Diagnosis not present

## 2018-04-30 DIAGNOSIS — F0391 Unspecified dementia with behavioral disturbance: Secondary | ICD-10-CM | POA: Diagnosis not present

## 2018-04-30 DIAGNOSIS — F331 Major depressive disorder, recurrent, moderate: Secondary | ICD-10-CM | POA: Diagnosis not present

## 2018-05-06 DIAGNOSIS — F331 Major depressive disorder, recurrent, moderate: Secondary | ICD-10-CM | POA: Diagnosis not present

## 2018-05-06 DIAGNOSIS — F0391 Unspecified dementia with behavioral disturbance: Secondary | ICD-10-CM | POA: Diagnosis not present

## 2018-05-07 DIAGNOSIS — F0391 Unspecified dementia with behavioral disturbance: Secondary | ICD-10-CM | POA: Diagnosis not present

## 2018-05-07 DIAGNOSIS — F331 Major depressive disorder, recurrent, moderate: Secondary | ICD-10-CM | POA: Diagnosis not present

## 2018-05-15 DIAGNOSIS — F331 Major depressive disorder, recurrent, moderate: Secondary | ICD-10-CM | POA: Diagnosis not present

## 2018-05-15 DIAGNOSIS — F0391 Unspecified dementia with behavioral disturbance: Secondary | ICD-10-CM | POA: Diagnosis not present

## 2018-05-20 DIAGNOSIS — R5381 Other malaise: Secondary | ICD-10-CM | POA: Diagnosis not present

## 2018-05-20 DIAGNOSIS — I4891 Unspecified atrial fibrillation: Secondary | ICD-10-CM | POA: Diagnosis not present

## 2018-05-20 DIAGNOSIS — N3941 Urge incontinence: Secondary | ICD-10-CM | POA: Diagnosis not present

## 2018-05-20 DIAGNOSIS — F068 Other specified mental disorders due to known physiological condition: Secondary | ICD-10-CM | POA: Diagnosis not present

## 2018-05-21 DIAGNOSIS — H43813 Vitreous degeneration, bilateral: Secondary | ICD-10-CM | POA: Diagnosis not present

## 2018-05-21 DIAGNOSIS — F0391 Unspecified dementia with behavioral disturbance: Secondary | ICD-10-CM | POA: Diagnosis not present

## 2018-05-21 DIAGNOSIS — H04121 Dry eye syndrome of right lacrimal gland: Secondary | ICD-10-CM | POA: Diagnosis not present

## 2018-05-21 DIAGNOSIS — Z961 Presence of intraocular lens: Secondary | ICD-10-CM | POA: Diagnosis not present

## 2018-05-21 DIAGNOSIS — R6889 Other general symptoms and signs: Secondary | ICD-10-CM | POA: Diagnosis not present

## 2018-05-21 DIAGNOSIS — H353131 Nonexudative age-related macular degeneration, bilateral, early dry stage: Secondary | ICD-10-CM | POA: Diagnosis not present

## 2018-05-21 DIAGNOSIS — R7989 Other specified abnormal findings of blood chemistry: Secondary | ICD-10-CM | POA: Diagnosis not present

## 2018-05-21 DIAGNOSIS — D649 Anemia, unspecified: Secondary | ICD-10-CM | POA: Diagnosis not present

## 2018-05-21 DIAGNOSIS — F331 Major depressive disorder, recurrent, moderate: Secondary | ICD-10-CM | POA: Diagnosis not present

## 2018-06-04 DIAGNOSIS — F331 Major depressive disorder, recurrent, moderate: Secondary | ICD-10-CM | POA: Diagnosis not present

## 2018-06-04 DIAGNOSIS — F0391 Unspecified dementia with behavioral disturbance: Secondary | ICD-10-CM | POA: Diagnosis not present

## 2018-06-11 DIAGNOSIS — F331 Major depressive disorder, recurrent, moderate: Secondary | ICD-10-CM | POA: Diagnosis not present

## 2018-06-11 DIAGNOSIS — F0391 Unspecified dementia with behavioral disturbance: Secondary | ICD-10-CM | POA: Diagnosis not present

## 2018-06-17 DIAGNOSIS — F331 Major depressive disorder, recurrent, moderate: Secondary | ICD-10-CM | POA: Diagnosis not present

## 2018-06-17 DIAGNOSIS — F0391 Unspecified dementia with behavioral disturbance: Secondary | ICD-10-CM | POA: Diagnosis not present

## 2018-06-18 DIAGNOSIS — F331 Major depressive disorder, recurrent, moderate: Secondary | ICD-10-CM | POA: Diagnosis not present

## 2018-06-18 DIAGNOSIS — F0391 Unspecified dementia with behavioral disturbance: Secondary | ICD-10-CM | POA: Diagnosis not present

## 2018-06-24 DIAGNOSIS — F0391 Unspecified dementia with behavioral disturbance: Secondary | ICD-10-CM | POA: Diagnosis not present

## 2018-06-24 DIAGNOSIS — F331 Major depressive disorder, recurrent, moderate: Secondary | ICD-10-CM | POA: Diagnosis not present

## 2018-06-29 DIAGNOSIS — F0391 Unspecified dementia with behavioral disturbance: Secondary | ICD-10-CM | POA: Diagnosis not present

## 2018-06-29 DIAGNOSIS — F331 Major depressive disorder, recurrent, moderate: Secondary | ICD-10-CM | POA: Diagnosis not present

## 2018-07-01 DIAGNOSIS — F0391 Unspecified dementia with behavioral disturbance: Secondary | ICD-10-CM | POA: Diagnosis not present

## 2018-07-01 DIAGNOSIS — F331 Major depressive disorder, recurrent, moderate: Secondary | ICD-10-CM | POA: Diagnosis not present

## 2018-07-02 DIAGNOSIS — F331 Major depressive disorder, recurrent, moderate: Secondary | ICD-10-CM | POA: Diagnosis not present

## 2018-07-02 DIAGNOSIS — F0391 Unspecified dementia with behavioral disturbance: Secondary | ICD-10-CM | POA: Diagnosis not present

## 2018-07-08 DIAGNOSIS — F0391 Unspecified dementia with behavioral disturbance: Secondary | ICD-10-CM | POA: Diagnosis not present

## 2018-07-08 DIAGNOSIS — F331 Major depressive disorder, recurrent, moderate: Secondary | ICD-10-CM | POA: Diagnosis not present

## 2018-07-09 DIAGNOSIS — F419 Anxiety disorder, unspecified: Secondary | ICD-10-CM | POA: Diagnosis not present

## 2018-07-09 DIAGNOSIS — F0391 Unspecified dementia with behavioral disturbance: Secondary | ICD-10-CM | POA: Diagnosis not present

## 2018-07-13 DIAGNOSIS — F419 Anxiety disorder, unspecified: Secondary | ICD-10-CM | POA: Diagnosis not present

## 2018-07-13 DIAGNOSIS — F0391 Unspecified dementia with behavioral disturbance: Secondary | ICD-10-CM | POA: Diagnosis not present

## 2018-07-16 DIAGNOSIS — F0391 Unspecified dementia with behavioral disturbance: Secondary | ICD-10-CM | POA: Diagnosis not present

## 2018-07-16 DIAGNOSIS — F419 Anxiety disorder, unspecified: Secondary | ICD-10-CM | POA: Diagnosis not present

## 2018-07-17 DIAGNOSIS — F068 Other specified mental disorders due to known physiological condition: Secondary | ICD-10-CM | POA: Diagnosis not present

## 2018-07-17 DIAGNOSIS — R5381 Other malaise: Secondary | ICD-10-CM | POA: Diagnosis not present

## 2018-07-17 DIAGNOSIS — N3941 Urge incontinence: Secondary | ICD-10-CM | POA: Diagnosis not present

## 2018-07-17 DIAGNOSIS — I4891 Unspecified atrial fibrillation: Secondary | ICD-10-CM | POA: Diagnosis not present

## 2018-07-23 DIAGNOSIS — F0391 Unspecified dementia with behavioral disturbance: Secondary | ICD-10-CM | POA: Diagnosis not present

## 2018-07-23 DIAGNOSIS — F419 Anxiety disorder, unspecified: Secondary | ICD-10-CM | POA: Diagnosis not present

## 2018-07-29 DIAGNOSIS — F0391 Unspecified dementia with behavioral disturbance: Secondary | ICD-10-CM | POA: Diagnosis not present

## 2018-07-29 DIAGNOSIS — F419 Anxiety disorder, unspecified: Secondary | ICD-10-CM | POA: Diagnosis not present

## 2018-08-01 DIAGNOSIS — Z880 Allergy status to penicillin: Secondary | ICD-10-CM | POA: Diagnosis not present

## 2018-08-01 DIAGNOSIS — Z86711 Personal history of pulmonary embolism: Secondary | ICD-10-CM | POA: Diagnosis not present

## 2018-08-01 DIAGNOSIS — F0390 Unspecified dementia without behavioral disturbance: Secondary | ICD-10-CM | POA: Diagnosis not present

## 2018-08-01 DIAGNOSIS — R410 Disorientation, unspecified: Secondary | ICD-10-CM | POA: Diagnosis not present

## 2018-08-01 DIAGNOSIS — F419 Anxiety disorder, unspecified: Secondary | ICD-10-CM | POA: Diagnosis not present

## 2018-08-01 DIAGNOSIS — F329 Major depressive disorder, single episode, unspecified: Secondary | ICD-10-CM | POA: Diagnosis not present

## 2018-08-01 DIAGNOSIS — R456 Violent behavior: Secondary | ICD-10-CM | POA: Diagnosis not present

## 2018-08-01 DIAGNOSIS — F69 Unspecified disorder of adult personality and behavior: Secondary | ICD-10-CM | POA: Diagnosis not present

## 2018-08-01 DIAGNOSIS — Z79899 Other long term (current) drug therapy: Secondary | ICD-10-CM | POA: Diagnosis not present

## 2018-08-01 DIAGNOSIS — E785 Hyperlipidemia, unspecified: Secondary | ICD-10-CM | POA: Diagnosis not present

## 2018-08-01 DIAGNOSIS — Z7901 Long term (current) use of anticoagulants: Secondary | ICD-10-CM | POA: Diagnosis not present

## 2018-08-01 DIAGNOSIS — I10 Essential (primary) hypertension: Secondary | ICD-10-CM | POA: Diagnosis not present

## 2018-08-01 DIAGNOSIS — K219 Gastro-esophageal reflux disease without esophagitis: Secondary | ICD-10-CM | POA: Diagnosis not present

## 2018-08-01 DIAGNOSIS — Z87442 Personal history of urinary calculi: Secondary | ICD-10-CM | POA: Diagnosis not present

## 2018-08-02 DIAGNOSIS — F989 Unspecified behavioral and emotional disorders with onset usually occurring in childhood and adolescence: Secondary | ICD-10-CM | POA: Diagnosis not present

## 2018-08-02 DIAGNOSIS — R4182 Altered mental status, unspecified: Secondary | ICD-10-CM | POA: Diagnosis not present

## 2018-08-05 DIAGNOSIS — F419 Anxiety disorder, unspecified: Secondary | ICD-10-CM | POA: Diagnosis not present

## 2018-08-05 DIAGNOSIS — F0391 Unspecified dementia with behavioral disturbance: Secondary | ICD-10-CM | POA: Diagnosis not present

## 2018-08-11 DIAGNOSIS — Z961 Presence of intraocular lens: Secondary | ICD-10-CM | POA: Diagnosis not present

## 2018-08-11 DIAGNOSIS — H04121 Dry eye syndrome of right lacrimal gland: Secondary | ICD-10-CM | POA: Diagnosis not present

## 2018-08-11 DIAGNOSIS — H43813 Vitreous degeneration, bilateral: Secondary | ICD-10-CM | POA: Diagnosis not present

## 2018-08-11 DIAGNOSIS — H353131 Nonexudative age-related macular degeneration, bilateral, early dry stage: Secondary | ICD-10-CM | POA: Diagnosis not present

## 2018-08-12 DIAGNOSIS — F0391 Unspecified dementia with behavioral disturbance: Secondary | ICD-10-CM | POA: Diagnosis not present

## 2018-08-12 DIAGNOSIS — F419 Anxiety disorder, unspecified: Secondary | ICD-10-CM | POA: Diagnosis not present

## 2018-08-13 DIAGNOSIS — F419 Anxiety disorder, unspecified: Secondary | ICD-10-CM | POA: Diagnosis not present

## 2018-08-13 DIAGNOSIS — F0391 Unspecified dementia with behavioral disturbance: Secondary | ICD-10-CM | POA: Diagnosis not present

## 2018-08-24 DIAGNOSIS — F419 Anxiety disorder, unspecified: Secondary | ICD-10-CM | POA: Diagnosis not present

## 2018-08-24 DIAGNOSIS — F0391 Unspecified dementia with behavioral disturbance: Secondary | ICD-10-CM | POA: Diagnosis not present

## 2018-08-31 DIAGNOSIS — F419 Anxiety disorder, unspecified: Secondary | ICD-10-CM | POA: Diagnosis not present

## 2018-08-31 DIAGNOSIS — F0391 Unspecified dementia with behavioral disturbance: Secondary | ICD-10-CM | POA: Diagnosis not present

## 2018-09-02 DIAGNOSIS — H6123 Impacted cerumen, bilateral: Secondary | ICD-10-CM | POA: Diagnosis not present

## 2018-09-02 DIAGNOSIS — H9193 Unspecified hearing loss, bilateral: Secondary | ICD-10-CM | POA: Diagnosis not present

## 2018-09-07 DIAGNOSIS — F0391 Unspecified dementia with behavioral disturbance: Secondary | ICD-10-CM | POA: Diagnosis not present

## 2018-09-07 DIAGNOSIS — F419 Anxiety disorder, unspecified: Secondary | ICD-10-CM | POA: Diagnosis not present

## 2018-09-14 DIAGNOSIS — F0391 Unspecified dementia with behavioral disturbance: Secondary | ICD-10-CM | POA: Diagnosis not present

## 2018-09-14 DIAGNOSIS — F419 Anxiety disorder, unspecified: Secondary | ICD-10-CM | POA: Diagnosis not present

## 2018-09-15 DIAGNOSIS — I4581 Long QT syndrome: Secondary | ICD-10-CM | POA: Diagnosis not present

## 2018-09-15 DIAGNOSIS — B351 Tinea unguium: Secondary | ICD-10-CM | POA: Diagnosis not present

## 2018-09-15 DIAGNOSIS — L603 Nail dystrophy: Secondary | ICD-10-CM | POA: Diagnosis not present

## 2018-09-15 DIAGNOSIS — M79675 Pain in left toe(s): Secondary | ICD-10-CM | POA: Diagnosis not present

## 2018-09-15 DIAGNOSIS — M79674 Pain in right toe(s): Secondary | ICD-10-CM | POA: Diagnosis not present

## 2018-09-15 DIAGNOSIS — R9431 Abnormal electrocardiogram [ECG] [EKG]: Secondary | ICD-10-CM | POA: Diagnosis not present

## 2018-09-15 DIAGNOSIS — F0391 Unspecified dementia with behavioral disturbance: Secondary | ICD-10-CM | POA: Diagnosis not present

## 2018-09-15 DIAGNOSIS — F419 Anxiety disorder, unspecified: Secondary | ICD-10-CM | POA: Diagnosis not present

## 2018-09-15 DIAGNOSIS — R000 Tachycardia, unspecified: Secondary | ICD-10-CM | POA: Diagnosis not present

## 2018-09-16 DIAGNOSIS — F419 Anxiety disorder, unspecified: Secondary | ICD-10-CM | POA: Diagnosis not present

## 2018-09-16 DIAGNOSIS — F0391 Unspecified dementia with behavioral disturbance: Secondary | ICD-10-CM | POA: Diagnosis not present

## 2018-09-16 DIAGNOSIS — N390 Urinary tract infection, site not specified: Secondary | ICD-10-CM | POA: Diagnosis not present

## 2018-09-16 DIAGNOSIS — R300 Dysuria: Secondary | ICD-10-CM | POA: Diagnosis not present

## 2018-09-16 DIAGNOSIS — R319 Hematuria, unspecified: Secondary | ICD-10-CM | POA: Diagnosis not present

## 2018-09-18 DIAGNOSIS — F068 Other specified mental disorders due to known physiological condition: Secondary | ICD-10-CM | POA: Diagnosis not present

## 2018-09-18 DIAGNOSIS — N3941 Urge incontinence: Secondary | ICD-10-CM | POA: Diagnosis not present

## 2018-09-18 DIAGNOSIS — R5381 Other malaise: Secondary | ICD-10-CM | POA: Diagnosis not present

## 2018-09-18 DIAGNOSIS — I4891 Unspecified atrial fibrillation: Secondary | ICD-10-CM | POA: Diagnosis not present

## 2018-09-19 DIAGNOSIS — R7989 Other specified abnormal findings of blood chemistry: Secondary | ICD-10-CM | POA: Diagnosis not present

## 2018-09-19 DIAGNOSIS — D649 Anemia, unspecified: Secondary | ICD-10-CM | POA: Diagnosis not present

## 2018-09-22 DIAGNOSIS — I4581 Long QT syndrome: Secondary | ICD-10-CM | POA: Diagnosis not present

## 2018-09-22 DIAGNOSIS — R9431 Abnormal electrocardiogram [ECG] [EKG]: Secondary | ICD-10-CM | POA: Diagnosis not present

## 2018-09-22 DIAGNOSIS — R000 Tachycardia, unspecified: Secondary | ICD-10-CM | POA: Diagnosis not present

## 2018-09-24 DIAGNOSIS — F411 Generalized anxiety disorder: Secondary | ICD-10-CM | POA: Diagnosis not present

## 2018-09-24 DIAGNOSIS — F0391 Unspecified dementia with behavioral disturbance: Secondary | ICD-10-CM | POA: Diagnosis not present

## 2018-10-08 DIAGNOSIS — F0281 Dementia in other diseases classified elsewhere with behavioral disturbance: Secondary | ICD-10-CM | POA: Diagnosis not present

## 2018-10-08 DIAGNOSIS — F411 Generalized anxiety disorder: Secondary | ICD-10-CM | POA: Diagnosis not present

## 2018-10-08 DIAGNOSIS — F39 Unspecified mood [affective] disorder: Secondary | ICD-10-CM | POA: Diagnosis not present

## 2018-10-15 DIAGNOSIS — F39 Unspecified mood [affective] disorder: Secondary | ICD-10-CM | POA: Diagnosis not present

## 2018-10-15 DIAGNOSIS — F411 Generalized anxiety disorder: Secondary | ICD-10-CM | POA: Diagnosis not present

## 2018-10-15 DIAGNOSIS — F0281 Dementia in other diseases classified elsewhere with behavioral disturbance: Secondary | ICD-10-CM | POA: Diagnosis not present

## 2018-10-19 DIAGNOSIS — F39 Unspecified mood [affective] disorder: Secondary | ICD-10-CM | POA: Diagnosis not present

## 2018-10-19 DIAGNOSIS — F411 Generalized anxiety disorder: Secondary | ICD-10-CM | POA: Diagnosis not present

## 2018-10-19 DIAGNOSIS — F0281 Dementia in other diseases classified elsewhere with behavioral disturbance: Secondary | ICD-10-CM | POA: Diagnosis not present

## 2019-03-24 ENCOUNTER — Other Ambulatory Visit: Payer: Self-pay | Admitting: *Deleted

## 2024-11-04 DEATH — deceased
